# Patient Record
Sex: Female | Born: 1985 | Race: Black or African American | Hispanic: No | Marital: Single | Smoking: Former smoker
Health system: Southern US, Community
[De-identification: ages and names within clinical notes are randomized; demographics above are authoritative.]

## PROBLEM LIST (undated history)

## (undated) ENCOUNTER — Inpatient Hospital Stay (HOSPITAL_COMMUNITY): Payer: Self-pay

## (undated) DIAGNOSIS — K5010 Crohn's disease of large intestine without complications: Secondary | ICD-10-CM

## (undated) HISTORY — DX: Crohn's disease of large intestine without complications: K50.10

---
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
## 2007-03-06 ENCOUNTER — Emergency Department (HOSPITAL_COMMUNITY): Admission: EM | Admit: 2007-03-06 | Discharge: 2007-03-06 | Payer: Self-pay | Admitting: Emergency Medicine

## 2007-06-29 ENCOUNTER — Inpatient Hospital Stay (HOSPITAL_COMMUNITY): Admission: AD | Admit: 2007-06-29 | Discharge: 2007-06-29 | Payer: Self-pay | Admitting: Gynecology

## 2008-02-17 ENCOUNTER — Emergency Department (HOSPITAL_COMMUNITY): Admission: EM | Admit: 2008-02-17 | Discharge: 2008-02-17 | Payer: Self-pay | Admitting: Emergency Medicine

## 2008-03-03 ENCOUNTER — Emergency Department (HOSPITAL_COMMUNITY): Admission: EM | Admit: 2008-03-03 | Discharge: 2008-03-03 | Payer: Self-pay | Admitting: Emergency Medicine

## 2008-11-03 ENCOUNTER — Emergency Department (HOSPITAL_COMMUNITY): Admission: EM | Admit: 2008-11-03 | Discharge: 2008-11-03 | Payer: Self-pay | Admitting: Emergency Medicine

## 2008-11-30 ENCOUNTER — Ambulatory Visit (HOSPITAL_COMMUNITY): Admission: RE | Admit: 2008-11-30 | Discharge: 2008-11-30 | Payer: Self-pay | Admitting: Chiropractic Medicine

## 2009-05-24 ENCOUNTER — Inpatient Hospital Stay (HOSPITAL_COMMUNITY): Admission: AD | Admit: 2009-05-24 | Discharge: 2009-05-24 | Payer: Self-pay | Admitting: Obstetrics and Gynecology

## 2009-09-06 ENCOUNTER — Encounter: Admission: RE | Admit: 2009-09-06 | Discharge: 2009-09-06 | Payer: Self-pay | Admitting: Gastroenterology

## 2010-01-05 ENCOUNTER — Emergency Department (HOSPITAL_COMMUNITY): Admission: EM | Admit: 2010-01-05 | Discharge: 2010-01-05 | Payer: Self-pay | Admitting: Emergency Medicine

## 2010-05-02 LAB — POCT PREGNANCY, URINE: Preg Test, Ur: NEGATIVE

## 2010-05-28 LAB — URINALYSIS, ROUTINE W REFLEX MICROSCOPIC
Glucose, UA: NEGATIVE mg/dL
Leukocytes, UA: NEGATIVE
Nitrite: NEGATIVE
Protein, ur: 30 mg/dL — AB
Specific Gravity, Urine: 1.039 — ABNORMAL HIGH (ref 1.005–1.030)
Urobilinogen, UA: 1 mg/dL (ref 0.0–1.0)
Urobilinogen, UA: 1 mg/dL (ref 0.0–1.0)
pH: 6 (ref 5.0–8.0)
pH: 6 (ref 5.0–8.0)

## 2010-05-28 LAB — CBC
HCT: 40.2 % (ref 36.0–46.0)
HCT: 37.8 % (ref 36.0–46.0)
Hemoglobin: 13.4 g/dL (ref 12.0–15.0)
Hemoglobin: 12.8 g/dL (ref 12.0–15.0)
MCHC: 33.5 g/dL (ref 30.0–36.0)
MCV: 94.3 fL (ref 78.0–100.0)
Platelets: 247 10*3/uL (ref 150–400)
RBC: 4.26 MIL/uL (ref 3.87–5.11)
RBC: 4.06 MIL/uL (ref 3.87–5.11)
RDW: 12.6 % (ref 11.5–15.5)
WBC: 10 10*3/uL (ref 4.0–10.5)
WBC: 6.6 10*3/uL (ref 4.0–10.5)

## 2010-05-28 LAB — COMPREHENSIVE METABOLIC PANEL
ALT: 14 U/L (ref 0–35)
AST: 23 U/L (ref 0–37)
Albumin: 3.7 g/dL (ref 3.5–5.2)
Alkaline Phosphatase: 51 U/L (ref 39–117)
Alkaline Phosphatase: 38 U/L — ABNORMAL LOW (ref 39–117)
BUN: 12 mg/dL (ref 6–23)
BUN: 13 mg/dL (ref 6–23)
CO2: 23 mEq/L (ref 19–32)
Chloride: 110 mEq/L (ref 96–112)
GFR calc Af Amer: >60 mL/min (ref >60)
Glucose, Bld: 81 mg/dL (ref 70–99)
Potassium: 3.8 mEq/L (ref 3.5–5.1)
Potassium: 3.9 mEq/L (ref 3.5–5.1)
Sodium: 137 mEq/L (ref 135–145)
Total Bilirubin: 0.5 mg/dL (ref 0.3–1.2)
Total Protein: 7.3 g/dL (ref 6.0–8.3)

## 2010-05-28 LAB — DIFFERENTIAL
Basophils Absolute: 0 10*3/uL (ref 0.0–0.1)
Basophils Relative: 0 % (ref 0–1)
Eosinophils Absolute: 0.1 10*3/uL (ref 0.0–0.7)
Eosinophils Relative: 2 % (ref 0–5)
Lymphs Abs: 1.8 10*3/uL (ref 0.7–4.0)
Monocytes Absolute: 0.7 10*3/uL (ref 0.1–1.0)
Monocytes Relative: 7 % (ref 3–12)
Monocytes Relative: 12 % (ref 3–12)
Neutro Abs: 4.6 10*3/uL (ref 1.7–7.7)
Neutrophils Relative %: 70 % (ref 43–77)

## 2010-05-28 LAB — GLUCOSE, CAPILLARY
Glucose-Capillary: 68 mg/dL — ABNORMAL LOW (ref 70–99)
Glucose-Capillary: 65 mg/dL — ABNORMAL LOW (ref 70–99)

## 2010-05-28 LAB — URINE MICROSCOPIC-ADD ON

## 2010-05-28 LAB — LIPASE, BLOOD: Lipase: 19 U/L (ref 11–59)

## 2010-05-28 LAB — PREGNANCY, URINE: Preg Test, Ur: NEGATIVE

## 2010-05-28 LAB — POCT PREGNANCY, URINE: Preg Test, Ur: NEGATIVE

## 2010-06-29 NOTE — H&P (Signed)
NAME:  [name], [name] NO.:  [id number]   MEDICAL RECORD NO.:  [id number]          PATIENT TYPE:  INP   LOCATION:  9165                          FACILITY:  [facility]   PHYSICIAN:  Janine Limbo, M.D.DATE OF BIRTH:  May 31, 1985   DATE OF ADMISSION:  01/26/2004  DATE OF DISCHARGE:                                HISTORY & PHYSICAL   HISTORY OF PRESENT ILLNESS:  [name] is a 25 year old single black  female, primigravida at 39-1/7 weeks, who presents with regular uterine  contractions tonight.  She denies leaking, bleeding, headache, nausea and  vomiting or visual disturbances.  Her pregnancy has been followed by the  [facility] certified nurse midwife service and has been  remarkable for:  #1 - Age 89, #2 - first trimester BV, #3 - group B strep  negative.  Her prenatal labs were collected on September 15, 2003, hemoglobin  10.6, hematocrit 31.3, platelets 227,000, blood type A-positive, RH-  negative, sickle cell trait negative, RPR nonreactive, rubella immune,  hepatitis B surface antigen negative, Pap smear within normal limits,  gonorrhea negative, Chlamydia negative, cystic fibrosis negative, quad  screen within normal limits.  Her 1-hour Glucola from November 08, 2003 was  74; RPR at that time was nonreactive; hemoglobin at that time was 11.9.  Culture of the vaginal tract for group B strep in the third trimester was  negative.   HISTORY OF PRESENT PREGNANCY:  She presented for care at [facility] on  Jul 12, 2003 at 10-6/7 weeks' gestation.  Pregnancy ultrasonography at 20  weeks' gestation shows growth consistent with previous dating, confirming  [facility] of February 01, 2004.  The rest of her prenatal care was unremarkable.   OBSTETRICAL HISTORY:  She is a primigravida.   MEDICAL HISTORY:  She experienced menarche at the age of 61 with 30-day  cycle intervals.  She reports having had the usual childhood illnesses.   ALLERGIES:  She has no  medication allergies.  She says that MILK induces  vomiting.   SURGICAL HISTORY:  Negative.   FAMILY MEDICAL HISTORY:  Family medical history is remarkable for  grandmother with MI and stroke, maternal uncle with increased blood  pressure, paternal grandmother with leukemia, maternal grandmother with  edema, paternal uncle died of AIDS, paternal aunt had throat cancer,  maternal grandmother with history of depression.   GENETIC HISTORY:  Genetic history is remarkable for first cousin with  leaking valves and had surgery.  The patient states she does not know a lot  about the father of the baby's family history.   SOCIAL HISTORY:  Father of the baby is involved; his name is Vantiless.  They are of the [employer] faith.  The patient is in the 10th grade and a high  school student.  Father of the baby has completed high school and is  unemployed.  She denies any alcohol, tobacco or illicit drug use with the  pregnancy.   OBJECTIVE DATA:  VITAL SIGNS:  Vital signs are stable.  She is afebrile.  HEENT:  HEENT is grossly within normal limits.  CHEST:  Chest is clear to auscultation.  HEART:  Regular rate and rhythm.  ABDOMEN:  Abdomen is gravid in contour with fundal height extending  approximately 39 cm above the pubic symphysis.  Fetal heart rate is  reassuring with positive accelerations and no decelerations, contractions  every 4 minutes.  Cervix is 7 cm per R.N. exam.  EXTREMITIES:  Extremities are normal.   ASSESSMENT:  1.  Intrauterine pregnancy at term.  2.  Active labor.  3.  Group B streptococcus negative.   PLAN:  1.  Plan is to admit to birthing suite; Dr. Janine Limbo has been      notified.  2.  Routine C.N.M. orders.  3.  The patient plans IV pain medication.  4.  Anticipate normal spontaneous vaginal birth.     Kimb   KS/MEDQ  D:  01/26/2004  T:  01/26/2004  Job:  [id number]

## 2010-06-29 NOTE — H&P (Signed)
[name] NO.:  [id number]   MEDICAL RECORD NO.:  [id number]          PATIENT TYPE:  INP   LOCATION:  1326                         FACILITY:  [facility]   PHYSICIAN:  Hillery Aldo, M.D.   DATE OF BIRTH:  1985-05-21   DATE OF ADMISSION:  12/15/2005  DATE OF DISCHARGE:                                HISTORY & PHYSICAL   PRIMARY CARE PHYSICIAN:  The patient is unassigned.   CHIEF COMPLAINT:  Headache, fever, abdominal pain.   HISTORY OF PRESENT ILLNESS:  The patient is a 25 year old female with vague  complaints of abdominal discomfort, intermittent fever, and intermittent  headache who was originally seen in the emergency department on November 23, 2005 with vague abdominal complaints.  She was discharged on Protonix and  Bentyl.  She represented to the emergency department yesterday with  complaint of headache.  She was discharged after a workup was unrevealing  and diagnosed with a viral illness.  She returned today still feeling  unwell.  The patient reports that she has had 2 years of abdominal pain that  started after her son was born.  She reports frequent bowel movements up to  10 times daily, some of which are loose.  She has had loose stools over the  past several days as well.  She also has had 4 days of indigestion and  nausea but no frank vomiting.  Denies any melena or hematochezia.  She has  not used any excessive NSAIDs or aspirin.  She just recently started taking  Goody's Powders for the fever and headaches.  The patient states that she  has been running a fever for the past 24 hours and has had several days of a  constant pounding headache.  She denies any blurry vision except when going  from sitting to standing when she gets lightheaded.  She also reports some  double vision at times.  She complains of insomnia but cannot really specify  as to what is keeping her awake.  The patient denies being sexually active  since January of last  year.  She has not had any unprotected sex.   PAST MEDICAL HISTORY:  The patient's past medical history is essentially  unremarkable.  She has had no surgeries.  She has had one vaginal delivery  and one pregnancy.   FAMILY HISTORY:  The patient's mother is alive at 63 and healthy.  Her  father died at 31 secondary to murder.  She has 3 other siblings were good  health.  Her son is in good health.   SOCIAL HISTORY:  The patient is single and lives with her mother and 2-year-  old son.  She denies any tobacco, alcohol or illicit drug use.  She works as  a Conservation officer, nature.   ALLERGIES:  No known drug allergies.   MEDICATIONS:  None except for occasional Goody's Powders.   REVIEW OF SYSTEMS:  The patient has had fever but no chills.  Her appetite  is fair.  Her energy level is adequate.  Denies any weight changes.  No  chest pain, shortness of  breath.  She has an occasional cough that she  describes as dry.  No pharyngitis.  She recently has had some nocturia but  no dysuria or hematuria.  Denies any myalgias or arthralgias.  No history of  seizures.  No sick contacts.   PHYSICAL EXAMINATION:  Temperature is 102.7, pulse 128, respirations 18,  blood pressure 86/50.  GENERAL:  This is a well-developed, well-nourished female who is slightly  ill appearing and febrile.  HEENT: Normocephalic, atraumatic.  PERRL.  EOMI.  Oropharynx clear.  Moist  mucous membranes.  NECK:  Supple, no thyromegaly, no lymphadenopathy, no jugular venous  distension.  No appreciable nuchal rigidity, and the patient can touch her  chin to her chest without any difficulty.  CHEST:  Lungs clear to auscultation bilaterally with good air movement.  HEART:  Tachycardiac rate, regular rhythm.  No murmurs, rubs, or gallops.  ABDOMEN:  Soft, slightly tender to deep palpation across the right upper and  left upper quadrants.  She does have bowel sounds.  RECTAL EXAM:  Normal tone.  No stool in the vault.  The effluent is  heme  negative.  EXTREMITIES:  No clubbing, edema, cyanosis.  SKIN:  Hot to touch.  No rashes.  NEUROLOGIC:  The patient is alert and oriented x3.  Cranial nerves II-XII  grossly intact.  Nonfocal.   DATA REVIEWED:  Laboratory data reveals a negative strep screen and a  negative mono screen.  Sodium is 137, potassium 3.8, chloride 108, bicarb  22, BUN 11, creatinine 0.93, glucose 91.  LFTs are all within normal limits.  Urine pregnancy test is negative.  White blood cell count is 9.4, hemoglobin  12.8, hematocrit 37.5, platelet count 235.   ASSESSMENT/PLAN:  1. Febrile illness of uncertain etiology:  The patient is clearly ill and      has been so for a fairly extended period time.  Initial laboratory      workup has been unrevealing.  Given her hypotension, fever, and      tachycardia, I am concern of an occult infectious process.  Given this,      we will admit the patient and get cultures of her blood and urine.  We      will also check a CT scan of her head, abdomen, and pelvis.  Would also      obtain an LP under fluoroscopy and send her cerebrospinal fluid for      analysis including Gram's stain and culture.  There is a broad      differential diagnosis here with viral meningitis, pelvic inflammatory      disease or tubo-ovarian abscess being the main contenders.  I will not      empirically put her on antibiotics until we have a clear idea of where      the infectious source is located.  For now, I will treat her nausea      with antiemetics p.r.n. and use pain medications for abdominal pain      control.  Additionally, given her hypotension, I would check a random      cortisol to ensure that she does not have any evidence of adrenal      insufficiency.  Will also check Epstein-Barr virus titers and do stool      studies looking for ova and parasites, C.  Difficile toxin, and culture      looking for gastrointestinal intestinal pathogens.  Would also check a     chest x-ray  for completeness and a TSH level as well as a urine drug      screen.  2. Prophylaxis:  Will initiate GI prophylaxis with Protonix and early      ambulation for DVT prophylaxis.      Hillery Aldo, M.D.  Electronically Signed     CR/MEDQ  D:  12/15/2005  T:  12/16/2005  Job:  [id number]

## 2010-06-29 NOTE — Discharge Summary (Signed)
[name], [name] NO.:  [id number]   MEDICAL RECORD NO.:  [id number]          PATIENT TYPE:  INP   LOCATION:  1326                         FACILITY:  [facility]   PHYSICIAN:  Michaelyn Barter, M.D. DATE OF BIRTH:  25-Jul-1985   DATE OF ADMISSION:  12/15/2005  DATE OF DISCHARGE:  12/19/2005                               DISCHARGE SUMMARY   PRIMARY CARE PHYSICIAN:  Unassigned.   FINAL DIAGNOSES:  1. Ileitis with ulceration.  2. Colitis.  3. Abdominal pain.  4. Diarrhea.  5. Hypokalemia.  6. Anemia.  7. Febrile illness.  8. Iron deficiency anemia.   CONSULTATIONS:  1. Infectious Disease with Dr. Cliffton Asters.  2. Gastroenterology with Dr. Charna Elizabeth and Dr. Jeani Hawking.   PROCEDURES:  1. Colonoscopy done November8 by Dr. Jeani Hawking.  2. CT scan of the head without contrast material completed on      November4,2007.  3. Chest x-ray completed November4,2007.  4. CT scan of the abdomen and pelvis completed November4,2007.  5. Ultrasound of the pelvis completed November5,2007.  6. Transvaginal ultrasound completed November5,2007.  7. Attempt was made at a lumbar puncture on November4,2007 by      Radiology.   HISTORY OF PRESENT ILLNESS:  [name] is an 25 year old female who  had been seen in the ER initially on October13, at which time she had  complained of vague abdominal discomfort.  She was discharged home on  Protonix.  She came back to the ER on the date of admission complaining  of a headache.  She was worked up and discharged with a diagnosis of  viral illness.  On the date of her admission, she came back stating that  she did not feel well.  She indicated that she had had 2 years of  abdominal pain which started shortly after her son was born.  She also  complained of loose, frequent bowel movements, up to at least 10 a day.  There had been 4 days of indigestion and nausea.  She had just started  taking Goody's powders for fever and  headaches.  She complained of a  fever for at least the past 24 hours prior to this admission.   PAST MEDICAL HISTORY:  Please see that dictated by Dr. Trula Ore Rama on  November4,2007.   HOSPITAL COURSE:  PROBLEM #1 - FEBRILE ILLNESS:  At the time of her  admission, the patient was found to be hypotensive with a blood pressure  of 86/50 and heart rate of 128.  Blood cultures were ordered and on  November4, they were drawn, the results of which were no growth x2.  Stool cultures were done on November4; they were negative for  Salmonella, Shigella, Campylobacter and Yersinia.  An HIV test was also  done and was found to be nonreactive on November8.  Likewise an Malachi Carl virus test was completed; it was found to be 4.77 on  November4,2007.  A urinalysis was done on November4; it was negative.  A  chest x-ray was also done on November4 and it was found to reveal only  minimal  bronchitic changes.  A CT scan was done of the patient's head on  November4 and this revealed mild chronic ethmoid sinusitis.  No acute  intracranial findings were noted.  Because of this uncertain nature of  the patient's fevers, an LP was also ordered.  It was attempted, but the  patient was noted to have ooze from the puncture site.  It was believed  that this was likely related to the Goody's powder that she had been  taking.  The radiologist recommended holding all aspirin products for  several days and a repeat test could be completed at that particular  time.  Eventually, an Infectious Disease consult was placed and Dr. Cliffton Asters followed the patient.   PROBLEM #2 - ABDOMINAL PAIN AND DIARRHEA:  A CT scan was completed of  the patient's abdomen on November4.  The CT of the abdomen revealed  prominent wall thickening in the ascending colon consistent with  prominent colitis.  A mildly distended gallbladder was also noted.  Midabdominal collection of gas and fluid appear to likely represents 3   adjacent dilated loops of small bowel.  The absence of oral contrast  make evaluation of the bowel problematic.  CT scan of the pelvis  revealed abnormal free pelvic fluid and abnormal thick-walled bowel  present, especially in the right abdomen.  Air fluid levels in the  pelvis were likely within the bowel, but due to lack of oral contrast  and due to the presence of free pelvic fluid, a pelvic abscess could not  be excluded.  There also appeared to be abnormal wall thickening in the  transverse colon.  A gastroenterology consult was placed at that  particular time.  An ultrasound of the patient's pelvis was completed on  November5; it revealed a moderate amount of free pelvic fluid,  unremarkable uterus and ovary.  Again, Gastroenterology was consulted.  Dr. Charna Elizabeth initially saw the patient on November5.  Her final  impression was that the patient was suffering from systemic inflammatory  response syndrome with signs and symptoms concerning for infectious  colitis.  [name] eventually underwent a colonoscopy done on  November8 by Dr. Jeani Hawking.  Dr. Haywood Pao findings on the colonoscopy  were that the patient had internal and external hemorrhoids; there was  also a short segment of ileitis noted with ulceration.  Multiple cold  biopsies were taken.  In the descending colon, the mucosa was slightly  abnormal.  Pathology results revealed focal active inflammation  involving the terminal ileum, minimal focal active inflammation  involving the descending colon.  The ileal biopsy showed large lymphoid  aggregates and focally there was minimal active neutrophilic  inflammation associated with focal early erosion.  The inflammatory  changes were mild and the differential diagnosis included Crohn's  disease as well as NSAID-related inflammation.  No granulomas were  identified.  The random descending colon biopsies had some abnormalities which raised the possibility of a small  aphthoid-type lesion which could  be associated with Crohn's disease.  By [name], the patient indicated  that she was feeling much better and actually requested to go home.  It  took some convincing to get the patient to stay in the hospital for  further evaluation and also it took some convincing to get the patient  to stay long enough so that Gastroenterology could further evaluate her  and perform the colonoscopy, but again, by [facility], the patient's  abdominal pain had resolved.  With regards to the patient's  fevers, Dr.  Orvan Falconer indicated on [place] that it was unclear what caused the  fevers and why did the patient all of a sudden feel better.  Empiric  antibiotics had been initiated and he recommended that they continue;  the patient had been placed on Flagyl and ciprofloxacin.  Stool cultures  were ordered during the course of this hospitalization.  Stool cultures  done on November4 were negative.  Stool was sent for ova and parasites  and they were found to be negative.  C.  difficile was done on November5  and November6, both of which were negative.  C-reactive protein was done  and was found to be elevated at 7.7, as well as a sed rate, which was  slightly elevated 34.  Again, by the date of the patient's discharge,  her fevers had resolved   PROBLEM #3.  HYPOKALEMIA:  The patient received supplementation for this  during the course of her hospitalization.   PROBLEM #4.  ANEMIA:  This may have been an acute on chronic process for  this patient.  She did again complain of multiple episodes of diarrhea;  however, she did deny having any melena or hematochezia.  Therefore, the  anemia was monitored closely over the course of her hospitalization.  Iron studies were done and on November7, the patient's iron level was  noted to be 13, her TIBC 189 and a percent saturation was 7, therefore  confirming the diagnosis of iron deficiency anemia.   PROBLEM #5 - IRON DEFICIENCY  ANEMIA.  This was worked up over the course  of her hospitalization.  Further workup could take place once the  patient follows up with her primary care physician.   PROBLEM #6 - HEMATURIA/KETONURIA:  It was believed that dehydration may  have contributed to some of this.  Again, the patient did have a  urinalysis completed over the course of her hospitalization and it was  found to be negative for a urinary tract infection.  She was treated  empirically with ciprofloxacin and Flagyl during the course of this  hospitalization.   PROBLEM #7 - HYPOTENSION:  This may have been related to the multiple  bouts of diarrhea that the patient had.  This did resolve by the date of  the patient's discharge from the hospital.   PROBLEM #8 - SYSTEMIC INFLAMMATORY RESPONSE SYNDROME:  Again, the  trigger for this was questionable.  The patient was treated with empiric  IV antibiotics throughout her hospitalization and her fevers improved over the course of her hospitalization.  By the date of discharge, her  white blood cell count was normal.   CONDITION ON THE DATE OF DISCHARGE:  On the date of discharge, the  patient stated that she adamantly wanted to go home.  She had actually  threatened to leave AMA the day prior to the date of discharge.  On the  date of discharge,  her vitals were temperature of 99.8, heart rate 95,  respirations 20, blood pressure 110/67.  O2 SAT was 100% on room air.  Her white blood cell count was 4.6, hemoglobin 10.9, hematocrit is 31.7,  platelets 252,000.  Sodium 139, potassium 3.7, chloride 108, CO2 25, BUN  3, creatinine is 0.74, glucose 108.  Calcium is 8.5.  The decision was  made to discharge the patient from the hospital.   DISCHARGE MEDICATIONS:  The patient was discharged home on:  1. Protonix 40 mg p.o. daily.  2. Oxycodone 5 mg p.o. q.8 h. p.r.n.  DISCHARGE INSTRUCTIONS:  She was told to follow up with her regular  doctor within 1 week and to take all of  her medications as prescribed.      Michaelyn Barter, M.D.  Electronically Signed     OR/MEDQ  D:  01/13/2006  T:  01/14/2006  Job:  [id number]

## 2010-06-29 NOTE — Consult Note (Signed)
[name], [name] NO.:  [id number]   MEDICAL RECORD NO.:  [id number]          PATIENT TYPE:  INP   LOCATION:  1326                         FACILITY:  [facility]   PHYSICIAN:  Anselmo Rod, M.D.  DATE OF BIRTH:  1985/10/22   DATE OF CONSULTATION:  DATE OF DISCHARGE:                                   CONSULTATION   REQUESTING PHYSICIAN:  [facility] team - Dr. Hillery Aldo, M.D.   REASON FOR CONSULTATION:  Evaluation for chronic diarrhea.   HISTORY OF PRESENT ILLNESS:  [name] is an 25 year old African-  American woman admitted on December 15, 2005 for significant abdominal  discomfort, fever and headache.  She apparently was seen in the emergency  room on November 23, 2005 for some of the similar symptoms, at which point  she was diagnosed with a viral-like illness and treated with Bentyl and  Protonix.  She was recently also seen by the emergency room staff on  December 14, 2005 with a recurrence of her headache and viral-like illness.  Apparently, she was told she had some type of viral syndrome.   She was seen again on December 15, 2005, still complaining of a frontal-like  headache with significant epigastric pain.  She had also noted diminished  appetite.  She had basically stated that she was trying Goody's Powder with  no significant relief.  Apparently, she was also noted to have significant  fever with a temperature of 103.  She, the day prior to admission, was  treated with IV fluids for dehydration, and all lab work concludes  urinalysis was felt to be negative.  Upon further review, Ms. Maudlin states  that she has been having significant frequent bowel movements, up to at  least 10 per day, although she denies them as being loose.  She denies any  bright red blood per rectum or any evidence of blood.  She also denies  melena.  She denies nonsteroidal anti-inflammatory drugs, as well as  aspirin.  She denies any foreign travel.  She denies  any ill contacts.  She  only has been using the [employer] recently, but otherwise is not taking  any other medications.   PAST MEDICAL HISTORY:  Negative.   PAST SURGICAL HISTORY:  Negative.   GYNECOLOGICAL HISTORY:  She is a G1, P1 with a vaginal delivery and no  complications.   SOCIAL HISTORY:  She is currently single and lives with her mother.  She has  a 34-year-old son.  She denies tobacco, alcohol or illicit drug use.  She  works as a Conservation officer, nature.   FAMILY HISTORY:  Mom is still alive at the age of 52 without complications,  father died in his late 76s secondary to a murder, she does have three  siblings without medical problems.  There was no known GI illness in this  family.   ALLERGIES:  NO KNOWN DRUG ALLERGIES.   MEDICATIONS:  None, except for use of Goodies Powder x3 doses.   HOSPITAL MEDICATIONS:  At present, she is treated with IV Protonix and  Dilaudid for pain.  REVIEW OF SYSTEMS:  CONSTITUTIONAL:  She denies any significant weight  changes, although she does endorse fever.  CARDIOVASCULAR:  She denies chest  pains or palpitations.  RESPIRATORY:  Denies shortness of breath, does  endorse a slight dry cough.  GENITOURINARY:  Denies any dysuria or pyuria  but does endorse some episodes of nocturia.  In addition, she denies having  any recent sex or any history of sexually transmitted diseases.  RHEUMATOLOGIC AL:  She denies any arthralgias or myalgias.  NEUROLOGIC:  She does on occasion feel blurry vision when she gets  lightheaded but denies any significant pain, photophobia or scotoma.   PHYSICAL EXAMINATION:  VITAL SIGNS:  Temperature 102.5, blood pressure  112/65 with a pulse of 119, respirations at 20 with 99% oxygenation on room  air.  GENERAL:  [name] was very alert and oriented this afternoon and did not  appear to be under any significant distress.  HEENT:  Oropharynx was unremarkable for any lesions or ulcers.  Pupils are  equal and reactive to  light and accommodation.  Sclerae was anicteric.  There was no cervical, supraclavicular, submandibular or axillary adenopathy  bilaterally, no thyromegaly.  LUNGS:  Clear to auscultation bilaterally with no wheezes, rales or rhonchi.  CARDIOVASCULAR:  Showed a tachycardic rate but otherwise is regular in it's  rhythm with no murmurs, rubs or gallops, and radial pulses were +2 and  strong bilaterally.  ABDOMEN:  Appeared somewhat slightly firm with hypoactive bowel sound  throughout.  There was some slight tenderness to palpation along all  quadrants with no focal point tenderness.  No rebounds, no guarding, no  hepatosplenomegaly appreciated.  SKIN:  Revealed unremarkable for rashes.  EXTREMITIES:  Also showed no significant clubbing or edema.   LABORATORY STUDIES:  Serum on December 15, 2005:  Sodium 138, potassium 3.4,  bicarb 22, chloride 110, BUN 5, creatinine 0.8, glucose 103, total bilirubin  1.1, alk phos 67, AST 24, ALT 20, albumin 2.7, total protein 6.0, calcium  8.3, hemoglobin 12.2, hematocrit 35.9, platelets 197, white blood cell count  9.1 with an absolute neutrophil count of 7.3.  TSH is 0.78, cortisol 8.6,  influenza A and B antigen was negative.  Streptococcus A screen negative,  mono screen negative.  Blood cultures have been negative x2.  Urinalysis  showed many bacteria with many squamous epithelial cells.  It was cloudy  with greater than 80 ketones and large amount of blood with small leukocyte  esterase.   IMAGING:  CT of the abdomen and pelvis obtained December 15, 2005  demonstrates abnormal wall thickening of the ascending colon, splenic  flexure, transverse colon, cecum with mucosal enhancement consistent with  colitis.  There is also some free fluid within the pelvis.  Concern for  Crohn's versus pseudomembranous colitis was raised.   IMPRESSION:  1. Systemic inflammatory response syndrome with signs and symptoms     concerning for infectious colitis.  Ms.  Minichiello denies any recent      antibiotic use and does not appear to have any family history for      inflammatory bowel disease.  However, in light of her fever,      tachycardia and slight leukocytosis, will proceed with at least      flexible sigmoidoscopy and possible biopsies to rule out Crohn's or      other inflammatory bowel disease.  2. Hematuria.  Given degree of hematuria with ketonuria, consideration for      urological workup may be  in order, if urine culture is unremarkable.  3. Generalized headache.  Primary care team was somewhat concerned for      viral syndrome with possible      meningitis.  Patient has proceeded for a fluoroscopic-guided lumbar      puncture to evaluate CSF for any evidence of infection.  Further      management per primary team.   Case has been discussed with the attending physician, Dr. Loreta Ave.      Coralie Carpen, M.D.      Anselmo Rod, M.D.  Electronically Signed    FR/MEDQ  D:  12/16/2005  T:  12/16/2005  Job:  [id number]   cc:   Hillery Aldo, M.D.   Anselmo Rod, M.D.  Fax: [phone]

## 2010-10-21 IMAGING — CR DG ABDOMEN ACUTE W/ 1V CHEST
3 series · 3 of 3 positions shown · non-contrast
Comparison: 12/15/2005 chest x-ray

CLINICAL DATA: Abdominal pain.

ACUTE ABDOMEN SERIES (ABDOMEN 2 VIEW & CHEST 1 VIEW)

[w chest pa]
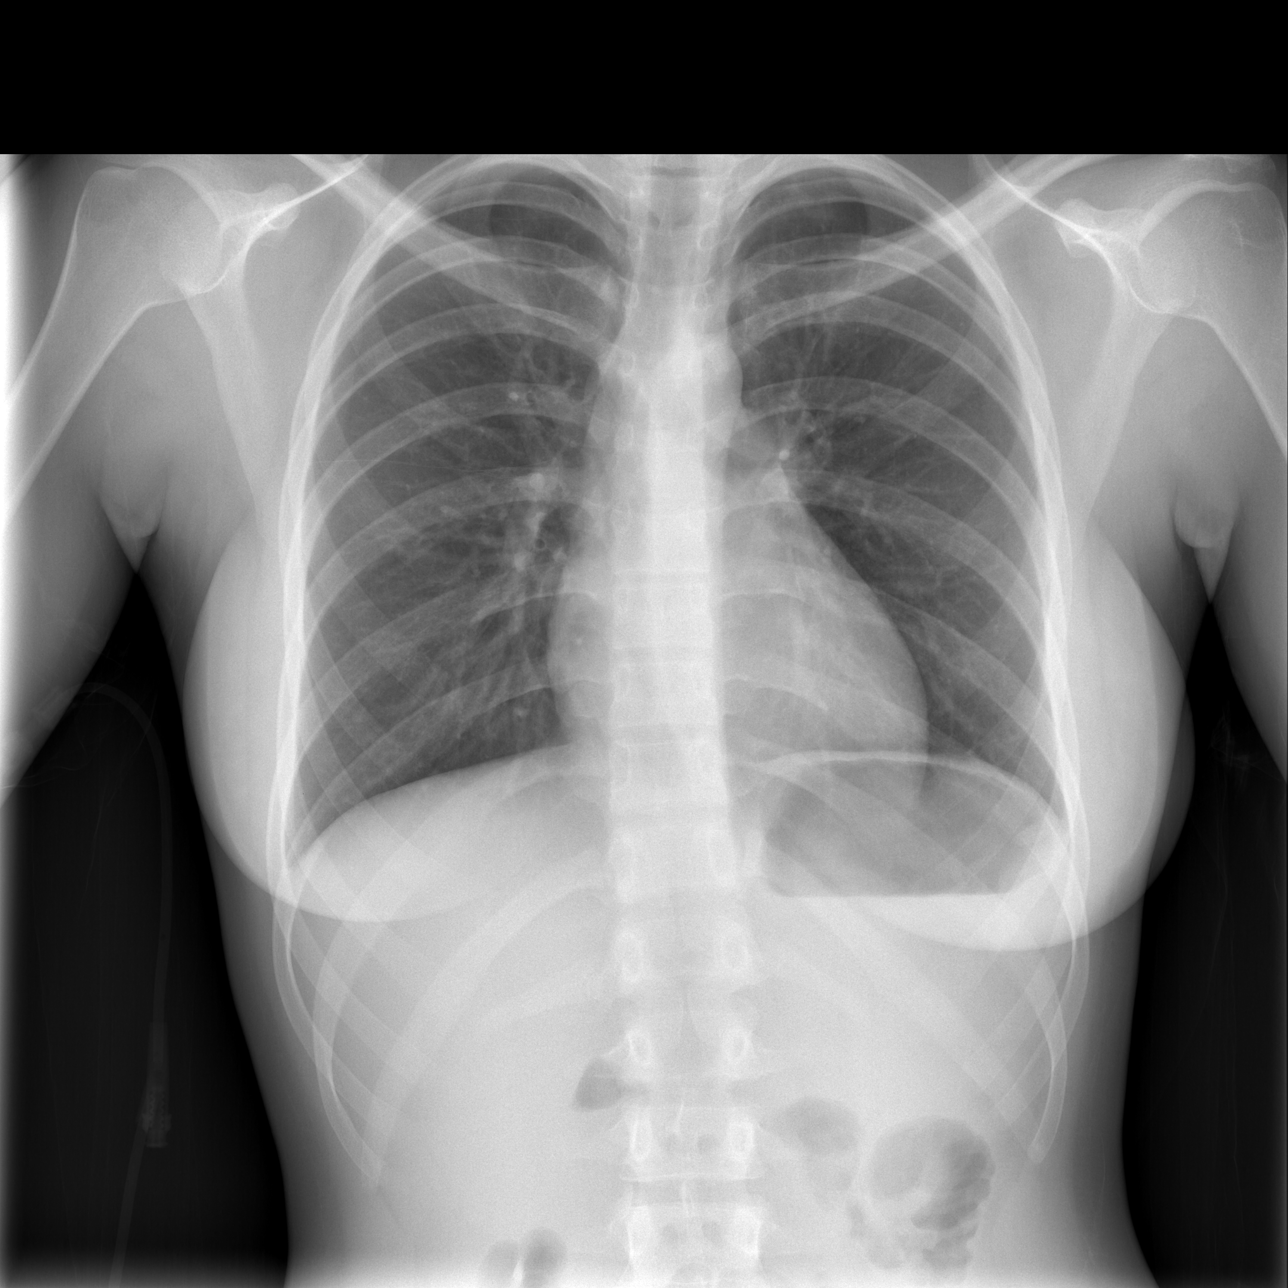

[w abdomen upright *]
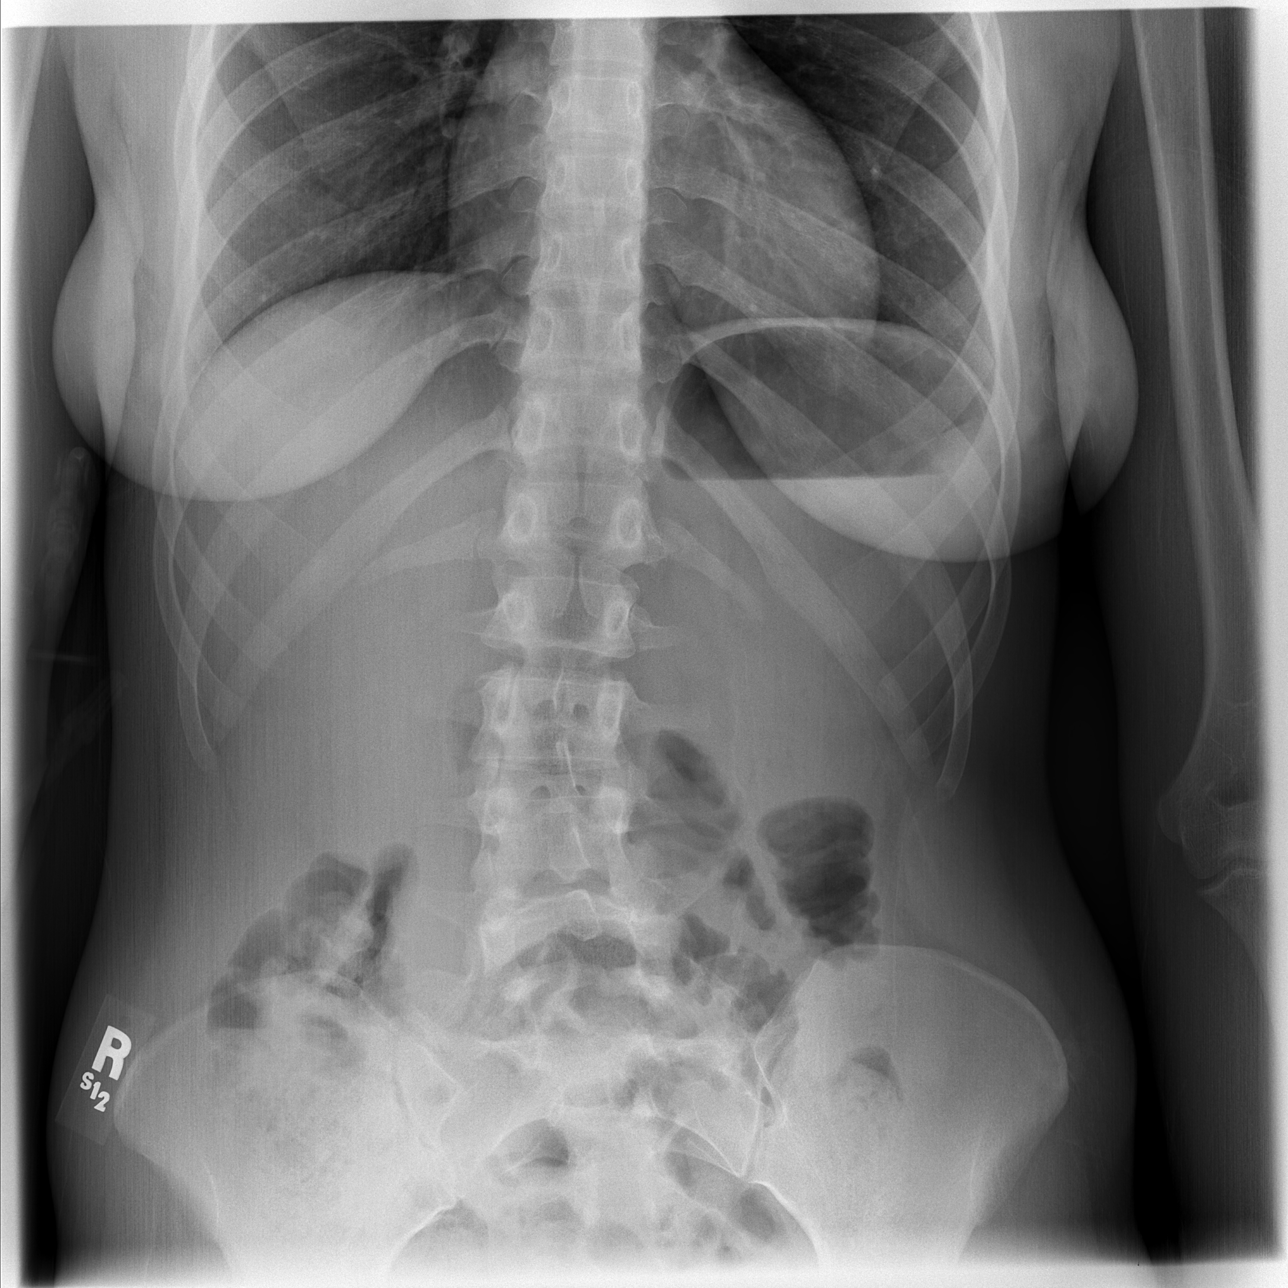

[t abdomen supine]
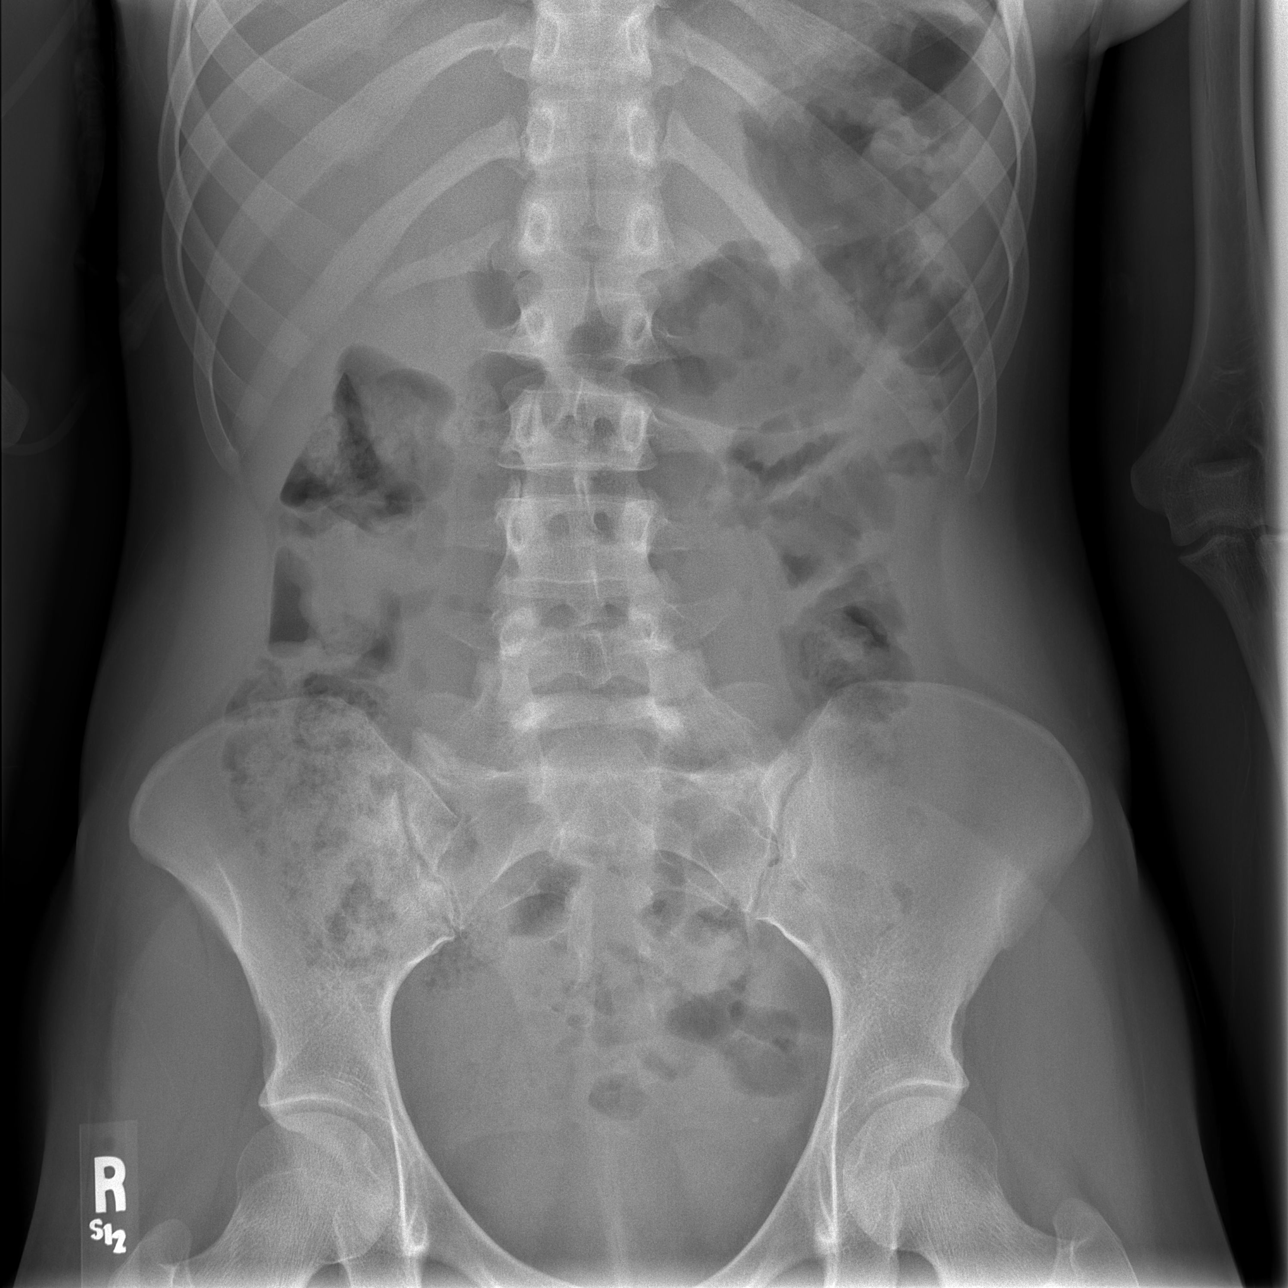

[3 of 3 positions shown; findings below may reference images not displayed]

FINDINGS: Cardiomediastinal silhouette is unremarkable.
The lungs are clear.
No evidence of focal airspace disease, pleural effusions, or
pneumothorax noted.

Nondistended gas-filled small bowel is present with some gas and
stool in the colon.
There is no evidence of bowel obstruction or pneumoperitoneum.
Gas and fluid in the stomach is identified.
No acute bony abnormalities are identified. A mild lumbar scoliosis
is identified.
IMPRESSION: Nonspecific nonobstructive bowel gas pattern - no evidence of
pneumoperitoneum.

No evidence of acute cardiopulmonary disease.

## 2010-11-01 LAB — RAPID STREP SCREEN (MED CTR MEBANE ONLY): Streptococcus, Group A Screen (Direct): NEGATIVE

## 2010-11-07 LAB — GC/CHLAMYDIA PROBE AMP, GENITAL: GC Probe Amp, Genital: NEGATIVE

## 2010-11-07 LAB — URINALYSIS, ROUTINE W REFLEX MICROSCOPIC
Bilirubin Urine: NEGATIVE
Hgb urine dipstick: NEGATIVE
Ketones, ur: NEGATIVE
Nitrite: NEGATIVE
Specific Gravity, Urine: 1.02
Urobilinogen, UA: 0.2
pH: 7

## 2010-11-07 LAB — WET PREP, GENITAL
Trich, Wet Prep: NONE SEEN
Yeast Wet Prep HPF POC: NONE SEEN

## 2010-11-07 LAB — POCT PREGNANCY, URINE: Operator id: 13440

## 2016-11-21 ENCOUNTER — Emergency Department (HOSPITAL_COMMUNITY)

## 2016-11-21 ENCOUNTER — Encounter (HOSPITAL_COMMUNITY): Payer: Self-pay | Admitting: *Deleted

## 2016-11-21 ENCOUNTER — Emergency Department (HOSPITAL_COMMUNITY)
Admission: EM | Admit: 2016-11-21 | Discharge: 2016-11-21 | Disposition: A | Discharge status: Home / Self Care | Attending: Emergency Medicine | Admitting: Emergency Medicine

## 2016-11-21 DIAGNOSIS — W231XXA Caught, crushed, jammed, or pinched between stationary objects, initial encounter: Secondary | ICD-10-CM | POA: Diagnosis not present

## 2016-11-21 DIAGNOSIS — Z23 Encounter for immunization: Secondary | ICD-10-CM | POA: Diagnosis not present

## 2016-11-21 DIAGNOSIS — Y9289 Other specified places as the place of occurrence of the external cause: Secondary | ICD-10-CM | POA: Insufficient documentation

## 2016-11-21 DIAGNOSIS — Y9389 Activity, other specified: Secondary | ICD-10-CM | POA: Diagnosis not present

## 2016-11-21 DIAGNOSIS — S62636B Displaced fracture of distal phalanx of right little finger, initial encounter for open fracture: Secondary | ICD-10-CM

## 2016-11-21 DIAGNOSIS — F17200 Nicotine dependence, unspecified, uncomplicated: Secondary | ICD-10-CM | POA: Diagnosis not present

## 2016-11-21 DIAGNOSIS — Y990 Civilian activity done for income or pay: Secondary | ICD-10-CM | POA: Insufficient documentation

## 2016-11-21 DIAGNOSIS — S6991XA Unspecified injury of right wrist, hand and finger(s), initial encounter: Secondary | ICD-10-CM | POA: Diagnosis present

## 2016-11-21 MED ORDER — FENTANYL CITRATE (PF) 100 MCG/2ML IJ SOLN
25.0000 ug | Freq: Once | INTRAMUSCULAR | Status: AC
  Administered 2016-11-21: 25 ug via INTRAVENOUS
  Filled 2016-11-21: qty 2

## 2016-11-21 MED ORDER — FENTANYL CITRATE (PF) 100 MCG/2ML IJ SOLN: 25.0000 ug | Freq: Once | INTRAMUSCULAR | Status: DC

## 2016-11-21 MED ORDER — CEPHALEXIN 500 MG PO CAPS: 500.0000 mg | ORAL_CAPSULE | Freq: Three times a day (TID) | ORAL | 0 refills | Status: AC

## 2016-11-21 MED ORDER — HYDROCODONE-ACETAMINOPHEN 5-325 MG PO TABS: 1.0000 | ORAL_TABLET | Freq: Four times a day (QID) | ORAL | 0 refills | Status: DC | PRN

## 2016-11-21 MED ORDER — TETANUS-DIPHTH-ACELL PERTUSSIS 5-2.5-18.5 LF-MCG/0.5 IM SUSP
0.5000 mL | Freq: Once | INTRAMUSCULAR | Status: AC
  Administered 2016-11-21: 0.5 mL via INTRAMUSCULAR
  Filled 2016-11-21: qty 0.5

## 2016-11-21 MED ORDER — LIDOCAINE HCL (PF) 1 % IJ SOLN
20.0000 mL | Freq: Once | INTRAMUSCULAR | Status: AC
  Administered 2016-11-21: 20 mL via INTRADERMAL

## 2016-11-21 MED ORDER — LIDOCAINE HCL (PF) 1 % IJ SOLN
INTRAMUSCULAR | Status: AC
  Filled 2016-11-21: qty 30

## 2016-11-21 MED ORDER — LIDOCAINE HCL 1 % IJ SOLN
20.0000 mL | Freq: Once | INTRAMUSCULAR | Status: DC
  Filled 2016-11-21: qty 20

## 2016-11-21 NOTE — Discharge Instructions (Signed)
Please read instructions below. Apply ice to your finger for 20 minutes at a time. Begin taking the antibiotic, Keflex, 3 times per day until it is gone. You can take hydrocodone every 6 hours as needed for severe pain. Do not take tylenol, drive, or drink alcohol while taking this medication. Schedule an appointment with Dr. Izora Ribas in 1 week to follow-up on your injury. Return to the ER for fever, pus draining from your finger, or new or concerning symptoms.

## 2016-11-21 NOTE — ED Triage Notes (Signed)
Pt slammed right little finger in the window ceil and a wire cage.  Pt has nail lifted and deformity.  Bleeding controlled.

## 2016-11-21 NOTE — ED Provider Notes (Signed)
[facility]-EMERGENCY DEPT Provider Note   CSN: [id number] Arrival date & time: 11/21/16  0865     History   Chief Complaint Chief Complaint  Patient presents with  . Finger Injury    HPI [name] is a 31 y.o. female without significant past medical history, presenting to the ED for acute onset of right fifth finger pain that occurred prior to arrival. Patient states she jammed her finger in between a window sill and a wire cage at work. She reports mild pain to the finger that is worse with movement. Tetanus is not up-to-date. No other injuries noted.  The history is provided by the patient.    History reviewed. No pertinent past medical history.  There are no active problems to display for this patient.   History reviewed. No pertinent surgical history.  OB History    No data available       Home Medications    Prior to Admission medications   Not on File    Family History No family history on file.  Social History Social History  Substance Use Topics  . Smoking status: Current Some Day Smoker  . Smokeless tobacco: Never Used  . Alcohol use No     Allergies   Patient has no known allergies.   Review of Systems Review of Systems  Musculoskeletal: Positive for arthralgias.  Skin: Positive for wound.     Physical Exam Updated Vital Signs BP 128/75   Pulse 80   Temp 98.6 F (37 C) (Oral)   Resp 16   LMP 11/19/2016   SpO2 100%   Physical Exam  Constitutional: She appears well-developed and well-nourished. No distress.  HENT:  Head: Normocephalic and atraumatic.  Eyes: Conjunctivae are normal.  Cardiovascular: Intact distal pulses.   Pulmonary/Chest: Effort normal.  Musculoskeletal:  Right fifth digit with deformity and edema to distal phalanx. Nail matrix is exposed and displaced outside of the skin. Normal sensation to tip of finger. Distal finger is warm and pink. Preserved active flexion and extension of digit. See images.    Psychiatric: She has a normal mood and affect. Her behavior is normal.  Nursing note and vitals reviewed.          ED Treatments / Results  Labs (all labs ordered are listed, but only abnormal results are displayed) Labs Reviewed - No data to display  EKG  EKG Interpretation None       Radiology Dg Finger Little Right  Result Date: 11/21/2016 CLINICAL DATA:  Right fifth finger injury today. EXAM: RIGHT LITTLE FINGER 2+V COMPARISON:  None. FINDINGS: Moderately displaced and comminuted fracture is seen involving the fifth distal phalanx. No soft tissue abnormality is noted. Joint spaces are intact. IMPRESSION: Moderately displaced and comminuted fifth distal phalangeal fracture. Electronically Signed   By: Lupita Raider, M.D.   On: 11/21/2016 08:46    Procedures Procedures (including critical care time)  Medications Ordered in ED Medications  Tdap (BOOSTRIX) injection 0.5 mL (0.5 mLs Intramuscular Given 11/21/16 1029)  fentaNYL (SUBLIMAZE) injection 25 mcg (25 mcg Intravenous Given 11/21/16 1217)     Initial Impression / Assessment and Plan / ED Course  I have reviewed the triage vital signs and the nursing notes.  Pertinent labs & imaging results that were available during my care of the patient were reviewed by me and considered in my medical decision making (see chart for details).     Pt presenting with right fifth finger injury after slamming her finger  between a wire crate and a windowsill today. X-ray with moderately displaced and comminuted distal phalanx fracture. On exam, nail matrix is exposed and displaced. NV intact. Finger soaked. Tdap updated. Consulted hand specialist, spoke with Shawna Orleans NP, who works with Dr. Izora Ribas. She states they will be scheduling pt in the OR today for treatment. Patient is to remain NPO. Pain treated in ED, while awaiting surgery.  Patient discussed with Dr. Criss Alvine.  Final Clinical Impressions(s) / ED Diagnoses   Final  diagnoses:  Displaced fracture of distal phalanx of right little finger, initial encounter for open fracture    New Prescriptions New Prescriptions   No medications on file     Russo, [place] N, PA-C 11/21/16 1325    Pricilla Loveless, MD 11/22/16 1651

## 2016-11-21 NOTE — ED Notes (Signed)
Ortho at bedside.

## 2016-11-21 NOTE — Brief Op Note (Signed)
   3:09 PM  PATIENT:  [name]  31 y.o. female  PRE-OPERATIVE DIAGNOSIS:  Open fracture to distal phalanx, nail bed injury to right small finger  POST-OPERATIVE DIAGNOSIS:  same  PROCEDURE:  ORIF distal phalanx fracture with 22g needle, partial nail plate removal, nail bed repair  SURGEON: Rosalio Catterton ANESTHESIA:   local   FINDINGS:  Open fracture, nail bed dislocation   PATIENT DISPOSITION:  home

## 2016-11-21 NOTE — Consult Note (Signed)
Reason for Consult:finger inju;ry Referring Physician: ER  CC:I hurt my finger at work  HPI:  [name] is an 31 y.o. right handed female who presents with small finger injury from work.  Pt states she slammed her finger between two objects at work this am, c/o bleeding, pain, deformity of small finger       .   Pain is rated at  8  /10 and is described as sharp  Pain is constant.  Pain is made better by rest/immobilization, worse with motion.   Associated signs/symptoms: Previous treatment:    History reviewed. No pertinent past medical history.  History reviewed. No pertinent surgical history.  No family history on file.  Social History:  reports that she has been smoking.  She has never used smokeless tobacco. She reports that she does not drink alcohol or use drugs.  Allergies: No Known Allergies  Medications: I have reviewed the patient's current medications.  No results found for this or any previous visit (from the past 48 hour(s)).  Dg Finger Little Right  Result Date: 11/21/2016 CLINICAL DATA:  Right fifth finger injury today. EXAM: RIGHT LITTLE FINGER 2+V COMPARISON:  None. FINDINGS: Moderately displaced and comminuted fracture is seen involving the fifth distal phalanx. No soft tissue abnormality is noted. Joint spaces are intact. IMPRESSION: Moderately displaced and comminuted fifth distal phalangeal fracture. Electronically Signed   By: Lupita Raider, M.D.   On: 11/21/2016 08:46    Pertinent items are noted in HPI. Temp:  [98.6 F (37 C)] 98.6 F (37 C) (10/11 0758) Pulse Rate:  [80-113] 81 (10/11 1400) Resp:  [14-20] 14 (10/11 1400) BP: (112-154)/(63-94) 112/63 (10/11 1400) SpO2:  [99 %-100 %] 99 % (10/11 1400) General appearance: alert and cooperative Resp: clear to auscultation bilaterally Cardio: regular rate and rhythm GI: soft, non-tender; bowel sounds normal; no masses,  no organomegaly Extremities: extremities normal, atraumatic, no cyanosis or  edema Except for right small finger with obvious nail deformity, nail dislocation  Assessment: Open fracture of distal phalanx, nail bed injury Plan: Needs distal phalanx reduction, nail bed repair. I have discussed this treatment plan in detail with patient, including the risks of the recommended treatment or surgery, the benefits and the alternatives.  The patient understands that additional treatment may be necessary.  Shirin Echeverry C Ermalinda Joubert 11/21/2016, 3:05 PM

## 2019-01-06 ENCOUNTER — Other Ambulatory Visit: Payer: Self-pay

## 2019-01-06 DIAGNOSIS — Z20822 Contact with and (suspected) exposure to covid-19: Secondary | ICD-10-CM

## 2019-01-07 LAB — NOVEL CORONAVIRUS, NAA: SARS-CoV-2, NAA: NOT DETECTED

## 2019-07-08 ENCOUNTER — Ambulatory Visit (HOSPITAL_COMMUNITY)
Admission: RE | Admit: 2019-07-08 | Discharge: 2019-07-08 | Disposition: A | Discharge status: Home / Self Care | Payer: PRIVATE HEALTH INSURANCE | Attending: Psychiatry | Admitting: Psychiatry

## 2019-07-08 DIAGNOSIS — F4481 Dissociative identity disorder: Secondary | ICD-10-CM | POA: Diagnosis not present

## 2019-07-08 DIAGNOSIS — F410 Panic disorder [episodic paroxysmal anxiety] without agoraphobia: Secondary | ICD-10-CM | POA: Insufficient documentation

## 2019-07-08 DIAGNOSIS — Z818 Family history of other mental and behavioral disorders: Secondary | ICD-10-CM | POA: Insufficient documentation

## 2019-07-08 DIAGNOSIS — F329 Major depressive disorder, single episode, unspecified: Secondary | ICD-10-CM | POA: Insufficient documentation

## 2019-07-08 DIAGNOSIS — F411 Generalized anxiety disorder: Secondary | ICD-10-CM | POA: Diagnosis not present

## 2019-07-08 DIAGNOSIS — R45851 Suicidal ideations: Secondary | ICD-10-CM | POA: Insufficient documentation

## 2019-07-08 DIAGNOSIS — F419 Anxiety disorder, unspecified: Secondary | ICD-10-CM | POA: Diagnosis not present
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
## 2021-06-13 ENCOUNTER — Ambulatory Visit (INDEPENDENT_AMBULATORY_CARE_PROVIDER_SITE_OTHER): Payer: No Typology Code available for payment source | Admitting: Radiology

## 2021-06-13 ENCOUNTER — Other Ambulatory Visit (HOSPITAL_COMMUNITY)
Admission: RE | Admit: 2021-06-13 | Discharge: 2021-06-13 | Disposition: A | Discharge status: Home / Self Care | Payer: No Typology Code available for payment source | Source: Ambulatory Visit | Attending: Radiology | Admitting: Radiology

## 2021-06-13 ENCOUNTER — Encounter: Payer: Self-pay | Admitting: Radiology

## 2021-06-13 VITALS — BP 126/84 | Ht 66.75 in | Wt 222.0 lb

## 2021-06-13 DIAGNOSIS — Z30018 Encounter for initial prescription of other contraceptives: Secondary | ICD-10-CM | POA: Diagnosis not present

## 2021-06-13 DIAGNOSIS — Z01419 Encounter for gynecological examination (general) (routine) without abnormal findings: Secondary | ICD-10-CM

## 2021-06-13 DIAGNOSIS — Z113 Encounter for screening for infections with a predominantly sexual mode of transmission: Secondary | ICD-10-CM

## 2021-06-13 MED ORDER — PHEXXI 1.8-1-0.4 % VA GEL: 5.0000 g | Freq: Every day | VAGINAL | 11 refills | Status: DC

## 2021-06-13 NOTE — Progress Notes (Signed)
? ?[name] 1985/10/10 [id number] ? ? ?History:  36 y.o. G1P0 presents for annual exam. No gyn concerns. Interested in non hormonal on demand BC. Has used hormones in the past and had irregular bleeding.  ? ?Gynecologic History ?Patient's last menstrual period was 06/04/2021 (approximate). ?Period Cycle (Days): 28 ?Period Duration (Days): 7 ?Period Pattern: Regular ?Menstrual Flow: Heavy (heavy 2nd day) ?Dysmenorrhea: (!) Mild ?Dysmenorrhea Symptoms: Cramping ?Contraception/Family planning: none ?Sexually active: yes ?Last Pap: 2016. Results were: abnormal ? ? ?Obstetric History ?OB History  ?Gravida Para Term Preterm AB Living  ?1         1  ?SAB IAB Ectopic Multiple Live Births  ?        1  ?  ?# Outcome Date GA Lbr Len/2nd Weight Sex Delivery Anes PTL Lv  ?1 Gravida           ? ? ? ?The following portions of the patient's history were reviewed and updated as appropriate: allergies, current medications, past family history, past medical history, past social history, past surgical history, and problem list. ? ?Review of Systems ?Pertinent items noted in HPI and remainder of comprehensive ROS otherwise negative.  ? ?Past medical history, past surgical history, family history and social history were all reviewed and documented in the EPIC chart. ? ? ?Exam: ? ?Vitals:  ? 06/13/21 1342  ?BP: 126/84  ?Weight: 222 lb (100.7 kg)  ?Height: 5' 6.75" (1.695 m)  ? ?Body mass index is 35.03 kg/m?. ? ?General appearance:  Normal ?Thyroid:  Symmetrical, normal in size, without palpable masses or nodularity. ?Respiratory ? Auscultation:  Clear without wheezing or rhonchi ?Cardiovascular ? Auscultation:  Regular rate, without rubs, murmurs or gallops ? Edema/varicosities:  Not grossly evident ?Abdominal ? Soft,nontender, without masses, guarding or rebound. ? Liver/spleen:  No organomegaly noted ? Hernia:  None appreciated ? Skin ? Inspection:  Grossly normal ?Breasts: Examined lying and sitting.  ? Right: Without masses,  retractions, nipple discharge or axillary adenopathy. ? ? Left: Without masses, retractions, nipple discharge or axillary adenopathy. ?Genitourinary  ? Inguinal/mons:  Normal without inguinal adenopathy ? External genitalia:  Normal appearing vulva with no masses, tenderness, or lesions ? BUS/Urethra/Skene's glands:  Normal without masses or exudate ? Vagina:  Normal appearing with normal color and discharge, no lesions ? Cervix:  Normal appearing without discharge or lesions ? Uterus:  Normal in size, shape and contour.  Mobile, nontender ? Adnexa/parametria:   ?  Rt: Normal in size, without masses or tenderness. ?  Lt: Normal in size, without masses or tenderness. ? Anus and perineum: Normal ?  ?Patient informed chaperone available to be present for breast and pelvic exam. Patient has requested no chaperone to be present. Patient has been advised what will be completed during breast and pelvic exam.  ? ?Assessment/Plan:   ?1. Well woman exam with routine gynecological exam ? ?- Cytology - PAP( [place]) with STI screen ? ?2. Routine screening for STI (sexually transmitted infection) ? ?- HIV antibody (with reflex) ?- RPR ?- Hepatitis C antibody ? ?3. Encounter for initial prescription of other contraceptives ? ?- Lactic Ac-Citric Ac-Pot Bitart (PHEXXI) 1.8-1-0.4 % GEL; Place 5 g vaginally daily. As needed  Dispense: 180 g; Refill: 11  ? ? ?Discussed SBE, pap and STI screening as directed/appropriate. Recommend of exercise weekly, including weight bearing exercise. Encouraged the use of seatbelts and sunscreen. ?Return in 1 year for annual or as needed.  ? ?Jeannie Mallinger B WHNP-BC 2:19 PM  06/13/2021  ?

## 2021-06-14 ENCOUNTER — Telehealth: Payer: Self-pay | Admitting: *Deleted

## 2021-06-14 LAB — HIV ANTIBODY (ROUTINE TESTING W REFLEX): HIV 1&2 Ab, 4th Generation: NONREACTIVE

## 2021-06-14 LAB — HEPATITIS C ANTIBODY
Hepatitis C Ab: NONREACTIVE
SIGNAL TO CUT-OFF: 0.11 (ref <1.00)

## 2021-06-14 LAB — RPR: RPR Ser Ql: NONREACTIVE

## 2021-06-14 NOTE — Telephone Encounter (Signed)
PA done via cover my meds for Phexxi 1.8-1.0.4% gel. Pending response from [employer].  

## 2021-06-15 ENCOUNTER — Other Ambulatory Visit: Payer: Self-pay

## 2021-06-15 DIAGNOSIS — R87612 Low grade squamous intraepithelial lesion on cytologic smear of cervix (LGSIL): Secondary | ICD-10-CM

## 2021-06-15 LAB — CYTOLOGY - PAP
Chlamydia: NEGATIVE
Comment: NEGATIVE
Comment: NEGATIVE
Comment: NEGATIVE
Comment: NEGATIVE
Comment: NEGATIVE
Comment: NORMAL
HPV 16: NEGATIVE
HPV 18 / 45: NEGATIVE
High risk HPV: POSITIVE — AB
Neisseria Gonorrhea: NEGATIVE
Trichomonas: NEGATIVE

## 2021-06-15 NOTE — Progress Notes (Signed)
GYNECOLOGY  VISIT ?  ?HPI: ?36 y.o.   Single  African American  female   ?G1P0 with Patient's last menstrual period was 06/04/2021 (approximate).   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   ?Her prior pap was 7 years ago, and it was normal then. ? ?States she has a history of HPV as a teen following pregnancy at age 44. ?No prior treatment.  ? ?Last intercourse was 3 weeks ago.   ? ?UPT negative today. ? ?GYNECOLOGIC HISTORY: ?Patient's last menstrual period was 06/04/2021 (approximate). ?Contraception: none ?Menopausal hormone therapy: none ?Last mammogram: n/a ?Last pap smear: 06/13/21-LGSIL, HPV+ ?       ?OB History   ? ? Gravida  ?1  ? Para  ?   ? Term  ?   ? Preterm  ?   ? AB  ?   ? Living  ?1  ?  ? ? SAB  ?   ? IAB  ?   ? Ectopic  ?   ? Multiple  ?   ? Live Births  ?1  ?   ?  ?  ?    ? ?There are no problems to display for this patient. ? ? ?Past Medical History:  ?Diagnosis Date  ? Crohn's colitis (HCC)   ? ? ?No past surgical history on file. ? ?Current Outpatient Medications  ?Medication Sig Dispense Refill  ? Cholecalciferol 1.25 MG (50000 UT) capsule Take by mouth.    ? Lactic Ac-Citric Ac-Pot Bitart (PHEXXI) 1.8-1-0.4 % GEL Place 5 g vaginally daily. As needed 180 g 11  ? VITAMIN E PO Take by mouth.    ? ?No current facility-administered medications for this visit.  ?  ? ?ALLERGIES: Patient has no known allergies. ? ?Family History  ?Problem Relation Age of Onset  ? Colon cancer Mother   ?     age at onset 6  ? Hypertension Mother   ? ? ?Social History  ? ?Socioeconomic History  ? Marital status: Single  ?  Spouse name: Not on file  ? Number of children: Not on file  ? Years of education: Not on file  ? Highest education level: Not on file  ?Occupational History  ? Not on file  ?Tobacco Use  ? Smoking status: Former  ?  Types: Cigarettes  ? Smokeless tobacco: Never  ?Substance and Sexual Activity  ? Alcohol use: No  ? Drug use: No  ? Sexual activity: Yes  ?  Partners: Male  ?  Birth control/protection:  None  ?Other Topics Concern  ? Not on file  ?Social History Narrative  ? Not on file  ? ?Social Determinants of Health  ? ?Financial Resource Strain: Not on file  ?Food Insecurity: Not on file  ?Transportation Needs: Not on file  ?Physical Activity: Not on file  ?Stress: Not on file  ?Social Connections: Not on file  ?Intimate Partner Violence: Not on file  ? ? ?Review of Systems  See HPI.  ? ?PHYSICAL EXAMINATION:   ? ?BP 124/82   Pulse 99   LMP 06/04/2021 (Approximate)   SpO2 96%     ?General appearance: alert, cooperative and appears stated age ? ?Colposcopy - cervix, vagina, and vulva.  ?Consent for procedure.  ?3% acetic acid used in vagina and on cervix. ?White light and green light filter used.  ?Colposcopy satisfactory:  Yes   __x___          No    _____ ?Findings:   large  squamocolumnar junction. ?Cervix:  Acetowhite area linear at 6:00, island of acetowhite change at 10:00.  ?Vagina:  no lesions ?Biopsies:   ECC, biopsy at 6:00, biopsy at 10:00. ?Monsel's placed.  ?Minimal EBL. ?No complications.  ? ?Chaperone was present for exam:  Ladona Ridgel, CMA ? ?ASSESSMENT ? ?LGSIL pap, positive HR HPV.  ? ?PLAN ? ?We discussed abnormal paps, HPV, colposcopy, and LEEP.  ?Fu biopsy results.  ?Start Gardasil vaccine today. ?  ?An After Visit Summary was printed and given to the patient. ? ? ? ?

## 2021-06-18 NOTE — Telephone Encounter (Signed)
Contraception coverage medical necessity letter appeal letter faxed [phone] ?

## 2021-06-18 NOTE — Telephone Encounter (Signed)
[employer] denied Phexxi Rx.  ? ?Medication is not a covered because it is not on the listing or formulary of approved drugs for your plan benefit. Please discuss alternative drug therapy with your doctor. ? ?The request for coverage for PHEXXI GEL, use as directed (180 per month), is denied. This decision is ?based on health plan criteria for PHEXXI GEL. This medicine is covered only if: ?All of the following: ?(1) You have a history of failure, contraindication, or intolerance to nonoxynol-9 based spermicide. ?(2) You have failed or cannot use over-the-counter spermicides. ?The information provided does not show that you meet the criteria listed above. ?Reviewed by: Rene Kocher.Ph. ?

## 2021-06-25 ENCOUNTER — Encounter: Payer: Self-pay | Admitting: Obstetrics and Gynecology

## 2021-06-25 ENCOUNTER — Ambulatory Visit (INDEPENDENT_AMBULATORY_CARE_PROVIDER_SITE_OTHER): Payer: No Typology Code available for payment source | Admitting: Obstetrics and Gynecology

## 2021-06-25 ENCOUNTER — Other Ambulatory Visit (HOSPITAL_COMMUNITY)
Admission: RE | Admit: 2021-06-25 | Discharge: 2021-06-25 | Disposition: A | Discharge status: Home / Self Care | Payer: No Typology Code available for payment source | Source: Ambulatory Visit | Attending: Obstetrics and Gynecology | Admitting: Obstetrics and Gynecology

## 2021-06-25 VITALS — BP 124/82 | HR 99

## 2021-06-25 DIAGNOSIS — R87612 Low grade squamous intraepithelial lesion on cytologic smear of cervix (LGSIL): Secondary | ICD-10-CM | POA: Insufficient documentation

## 2021-06-25 DIAGNOSIS — Z23 Encounter for immunization: Secondary | ICD-10-CM

## 2021-06-25 DIAGNOSIS — Z01812 Encounter for preprocedural laboratory examination: Secondary | ICD-10-CM | POA: Diagnosis not present

## 2021-06-25 LAB — PREGNANCY, URINE: Preg Test, Ur: NEGATIVE

## 2021-06-25 NOTE — Patient Instructions (Signed)
Colposcopy, Care After  The following information offers guidance on how to care for yourself after your procedure. Your doctor may also give you more specific instructions. If you have problems or questions, contact your doctor. What can I expect after the procedure? If you did not have a sample of your tissue taken out (did not have a biopsy), you may only have some spotting of blood for a few days. You can go back to your normal activities. If you had a sample of your tissue taken out, it is common to have: Soreness and mild pain. These may last for a few days. Mild bleeding or fluid (discharge) coming from your vagina. The fluid will look dark and grainy. You may have this for a few days. The fluid may be caused by a liquid that was used during your procedure. You may need to wear a sanitary pad. Spotting of blood for at least 48 hours after the procedure. Follow these instructions at home: Medicines Take over-the-counter and prescription medicines only as told by your doctor. Ask your doctor what over-the-counter pain medicines and prescription medicines you can start taking again. This is very important if you take blood thinners. Activity For at least 3 days, or for as long as told by your doctor, avoid: Douching. Using tampons. Having sex. Return to your normal activities as told by your doctor. Ask your doctor what activities are safe for you. General instructions Ask your doctor if you may take baths, swim, or use a hot tub. You may take showers. If you use birth control (contraception), keep using it. Keep all follow-up visits. Contact a doctor if: You have a fever or chills. You faint or feel light-headed. Get help right away if: You bleed a lot from your vagina. A lot of bleeding means that the bleeding soaks through a pad in less than 1 hour. You have clumps of blood (blood clots) coming from your vagina. You have signs that could mean you have an infection. This may be  fluid coming from your vagina that is: Different than normal. Yellow. Bad-smelling. You have very bad pain or cramps in your lower belly that do not get better with medicine. Summary If you did not have a sample of your tissue taken out, you may only have some spotting of blood for a few days. You can go back to your normal activities. If you had a sample of your tissue taken out, it is common to have mild pain for a few days and spotting for 48 hours. Avoid douching, using tampons, and having sex for at least 3 days after the procedure or for as long as told. Get help right away if you have a lot of bleeding, very bad pain, or signs of infection. This information is not intended to replace advice given to you by your health care provider. Make sure you discuss any questions you have with your health care provider. Document Revised: 06/25/2020 Document Reviewed: 06/25/2020 Elsevier Patient Education  2023 Elsevier Inc.  

## 2021-06-26 LAB — SURGICAL PATHOLOGY

## 2021-08-27 ENCOUNTER — Ambulatory Visit: Payer: No Typology Code available for payment source

## 2021-08-27 NOTE — Progress Notes (Deleted)
Patient in today for 2nd Gardasil injection.   Contraception: Phexxi  LMP: *** Last AEX: 06/13/21 with Dr. Edward Jolly   Injection given in ***. Patient tolerated shot well.   Patient informed next injection due in about 4 months.  Advised patient, if not on birth control, to return for next injection with cycle.   Routed to provider for final review.  Encounter closed.

## 2021-08-29 ENCOUNTER — Ambulatory Visit: Payer: No Typology Code available for payment source

## 2021-08-30 ENCOUNTER — Ambulatory Visit (INDEPENDENT_AMBULATORY_CARE_PROVIDER_SITE_OTHER): Payer: No Typology Code available for payment source | Admitting: *Deleted

## 2021-08-30 DIAGNOSIS — Z23 Encounter for immunization: Secondary | ICD-10-CM | POA: Diagnosis not present

## 2022-01-01 ENCOUNTER — Ambulatory Visit (INDEPENDENT_AMBULATORY_CARE_PROVIDER_SITE_OTHER): Payer: No Typology Code available for payment source | Admitting: *Deleted

## 2022-01-01 DIAGNOSIS — Z23 Encounter for immunization: Secondary | ICD-10-CM | POA: Diagnosis not present

## 2023-02-12 NOTE — L&D Delivery Note (Signed)
 Delivery Note:   G2P1001 at [redacted]w[redacted]d  Admitting diagnosis: Vaginal bleeding in pregnancy, third trimester [O46.93] Preeclampsia, severe [O14.10] Risks: Suspected abruption; PEC with severe features; AMA; GBS presumptive negative but treated with PCN due to preterm;  Onset of labor: 10/13/2023 at 0035, clear fluid IOL/Augmentation: AROM, Pitocin , and IP Foley ROM: 10/13/2023 at 0035  Complete dilation at 10/13/2023 0910 Onset of pushing at 0915 FHR second stage Cat I  Analgesia/Anesthesia intrapartum:None Pushing in hands and knees position with CNM and L&D staff support. Partner, [name], mother, [name], and doula, [name], present for birth and supportive.  Delivery of a Live born female  Birth Weight:  pending APGAR: 6, 9   Newborn Delivery   Birth date/time: 10/13/2023 09:17:00 Delivery type: Vaginal, Spontaneous    in cephalic presentation, position OA to ROA.  APGAR:1 min-6 , 5 min-9   Nuchal Cord: Yes  x 1 Cord double clamped and cut by CNM.  Collection of cord blood for typing completed. Arterial cord blood sample-No   Placenta delivered-Spontaneous with 3 vessels. Uterotonics: Pitocin , TXA Placenta to patient per request Uterine tone firm  Bleeding scant  None laceration identified.  Episiotomy:None Local analgesia: N/A  Repair: N/A Est. Blood Loss (mL): 50.00 Complications: Placental Abruption  Mom to postpartum. Baby [name] to Couplet care / Skin to Skin.  Delivery Report:   Review the Delivery Report for details.    [name], CNM, MSN 10/13/2023, 9:34 AM

## 2023-03-10 ENCOUNTER — Encounter (HOSPITAL_COMMUNITY): Payer: Self-pay | Admitting: *Deleted

## 2023-03-10 ENCOUNTER — Inpatient Hospital Stay (HOSPITAL_COMMUNITY)
Admission: AD | Admit: 2023-03-10 | Discharge: 2023-03-10 | Disposition: A | Discharge status: Home / Self Care | Payer: Medicaid Other | Attending: Obstetrics and Gynecology | Admitting: Obstetrics and Gynecology

## 2023-03-10 ENCOUNTER — Inpatient Hospital Stay (HOSPITAL_COMMUNITY): Payer: Medicaid Other

## 2023-03-10 DIAGNOSIS — O3680X0 Pregnancy with inconclusive fetal viability, not applicable or unspecified: Secondary | ICD-10-CM

## 2023-03-10 DIAGNOSIS — O209 Hemorrhage in early pregnancy, unspecified: Secondary | ICD-10-CM

## 2023-03-10 DIAGNOSIS — Z3A01 Less than 8 weeks gestation of pregnancy: Secondary | ICD-10-CM | POA: Diagnosis not present

## 2023-03-10 DIAGNOSIS — Z87891 Personal history of nicotine dependence: Secondary | ICD-10-CM | POA: Diagnosis not present

## 2023-03-10 LAB — COMPREHENSIVE METABOLIC PANEL
ALT: 15 U/L (ref 0–44)
AST: 23 U/L (ref 15–41)
Albumin: 3.5 g/dL (ref 3.5–5.0)
Alkaline Phosphatase: 39 U/L (ref 38–126)
Anion gap: 8 (ref 5–15)
BUN: 10 mg/dL (ref 6–20)
CO2: 20 mmol/L — ABNORMAL LOW (ref 22–32)
Calcium: 9 mg/dL (ref 8.9–10.3)
Chloride: 109 mmol/L (ref 98–111)
Creatinine, Ser: 0.79 mg/dL (ref 0.44–1.00)
GFR, Estimated: >60 mL/min (ref >60)
Glucose, Bld: 89 mg/dL (ref 70–99)
Potassium: 4 mmol/L (ref 3.5–5.1)
Sodium: 137 mmol/L (ref 135–145)
Total Bilirubin: 0.6 mg/dL (ref 0.0–1.2)
Total Protein: 6.6 g/dL (ref 6.5–8.1)

## 2023-03-10 LAB — URINALYSIS, ROUTINE W REFLEX MICROSCOPIC
Bilirubin Urine: NEGATIVE
Glucose, UA: NEGATIVE mg/dL
Hgb urine dipstick: NEGATIVE
Ketones, ur: NEGATIVE mg/dL
Leukocytes,Ua: NEGATIVE
Nitrite: NEGATIVE
Protein, ur: NEGATIVE mg/dL
Specific Gravity, Urine: 1.03 (ref 1.005–1.030)
pH: 5 (ref 5.0–8.0)

## 2023-03-10 LAB — WET PREP, GENITAL
Clue Cells Wet Prep HPF POC: NONE SEEN
Sperm: NONE SEEN
Trich, Wet Prep: NONE SEEN
WBC, Wet Prep HPF POC: <10 (ref <10)
Yeast Wet Prep HPF POC: NONE SEEN

## 2023-03-10 LAB — CBC
HCT: 36.9 % (ref 36.0–46.0)
Hemoglobin: 12.6 g/dL (ref 12.0–15.0)
MCH: 30.4 pg (ref 26.0–34.0)
MCHC: 34.1 g/dL (ref 30.0–36.0)
MCV: 88.9 fL (ref 80.0–100.0)
Platelets: 295 10*3/uL (ref 150–400)
RBC: 4.15 MIL/uL (ref 3.87–5.11)
RDW: 12.7 % (ref 11.5–15.5)
WBC: 10.8 10*3/uL — ABNORMAL HIGH (ref 4.0–10.5)
nRBC: 0 % (ref 0.0–0.2)

## 2023-03-10 LAB — HCG, QUANTITATIVE, PREGNANCY: hCG, Beta Chain, Quant, S: 2245 m[IU]/mL — ABNORMAL HIGH (ref <5)

## 2023-03-10 LAB — POCT PREGNANCY, URINE: Preg Test, Ur: POSITIVE — AB

## 2023-03-10 NOTE — MAU Provider Note (Signed)
History     [id number]  Arrival date and time: 03/10/23 1425    Chief Complaint  Patient presents with   Abdominal Pain   Vaginal Bleeding     HPI [name] is a 38 y.o. at [redacted]w[redacted]d by unsure LMP, who presents for pelvic pain and vaginal spotting.   Patient reports she found out she was unexpectedly pregnant last week Since then has been having intermittent low pelvic cramping and occasional spotting when using the bathroom No burning or pain with urination No vaginal discharge   --/--/A POS Performed at [facility], [address], [place], [place] [address]  (01/27 1606)  OB History     Gravida  2   Para      Term      Preterm      AB      Living  1      SAB      IAB      Ectopic      Multiple      Live Births  1           Past Medical History:  Diagnosis Date   Crohn's colitis (HCC)     No past surgical history on file.  Family History  Problem Relation Age of Onset   Colon cancer Mother        age at onset 41   Hypertension Mother     Social History   Socioeconomic History   Marital status: Single    Spouse name: Not on file   Number of children: Not on file   Years of education: Not on file   Highest education level: Not on file  Occupational History   Not on file  Tobacco Use   Smoking status: Former    Types: Cigarettes   Smokeless tobacco: Never  Substance and Sexual Activity   Alcohol use: No   Drug use: No   Sexual activity: Yes    Partners: Male    Birth control/protection: None  Other Topics Concern   Not on file  Social History Narrative   Not on file   Social Drivers of Health   Financial Resource Strain: Low Risk  (03/04/2023)   Received from [facility]   Overall Financial Resource Strain (CARDIA)    Difficulty of Paying Living Expenses: Not hard at all  Food Insecurity: No Food Insecurity (03/04/2023)   Received from [facility]   Hunger Vital Sign    Worried About Running Out of  Food in the Last Year: Never true    Ran Out of Food in the Last Year: Never true  Transportation Needs: No Transportation Needs (03/04/2023)   Received from [facility] - Transportation    Lack of Transportation (Medical): No    Lack of Transportation (Non-Medical): No  Physical Activity: Not on file  Stress: Not on file  Social Connections: Unknown (06/11/2021)   Received from [facility]   Social Network    Social Network: Not on file  Intimate Partner Violence: Unknown (06/11/2021)   Received from [facility]   HITS    Physically Hurt: Not on file    Insult or Talk Down To: Not on file    Threaten Physical Harm: Not on file    Scream or Curse: Not on file    No Known Allergies  No current facility-administered medications on file prior to encounter.   Current Outpatient Medications on File Prior to Encounter  Medication Sig Dispense Refill   Multiple Vitamin (MULTIVITAMIN) capsule Take 1 capsule by mouth daily.     Cholecalciferol 1.25 MG (50000 UT) capsule Take by mouth. (Patient not taking: Reported on 03/10/2023)     Lactic Ac-Citric Ac-Pot Bitart (PHEXXI) 1.8-1-0.4 % GEL Place 5 g vaginally daily. As needed (Patient not taking: Reported on 03/10/2023) 180 g 11   VITAMIN E PO Take by mouth. (Patient not taking: Reported on 03/10/2023)       ROS Pertinent positives and negative per HPI, all others reviewed and negative  Physical Exam   BP 132/75   Pulse 98   Temp 98.6 F (37 C)   Resp 18   Ht 5\' 8"  (1.727 m)   Wt 101.2 kg   LMP 01/26/2023 (Within Weeks)   BMI 33.91 kg/m   Patient Vitals for the past 24 hrs:  BP Temp Pulse Resp Height Weight  03/10/23 1548 132/75 98.6 F (37 C) 98 18 5\' 8"  (1.727 m) 101.2 kg    Physical Exam Vitals reviewed.  Constitutional:      General: She is not in acute distress.    Appearance: She is well-developed. She is not diaphoretic.  Eyes:     General: No scleral icterus. Pulmonary:     Effort: Pulmonary  effort is normal. No respiratory distress.  Abdominal:     General: There is no distension.     Palpations: Abdomen is soft.     Tenderness: There is no abdominal tenderness. There is no guarding or rebound.  Skin:    General: Skin is warm and dry.  Neurological:     Mental Status: She is alert.     Coordination: Coordination normal.      Cervical Exam    Bedside Ultrasound Pt informed that the ultrasound is considered a limited OB ultrasound and is not intended to be a complete ultrasound exam.  Patient also informed that the ultrasound is not being completed with the intent of assessing for fetal or placental anomalies or any pelvic abnormalities.  Explained that the purpose of today's ultrasound is to assess for  viability.  Patient acknowledges the purpose of the exam and the limitations of the study.      My interpretation: possible IUGS but very small and not well visualized. No large adnexal masses appreciated.    Labs Results for orders placed or performed during the hospital encounter of 03/10/23 (from the past 24 hours)  Pregnancy, urine POC     Status: Abnormal   Collection Time: 03/10/23  3:39 PM  Result Value Ref Range   Preg Test, Ur POSITIVE (A) NEGATIVE  Urinalysis, Routine w reflex microscopic -Urine, Clean Catch     Status: Abnormal   Collection Time: 03/10/23  3:51 PM  Result Value Ref Range   Color, Urine YELLOW YELLOW   APPearance HAZY (A) CLEAR   Specific Gravity, Urine 1.030 1.005 - 1.030   pH 5.0 5.0 - 8.0   Glucose, UA NEGATIVE NEGATIVE mg/dL   Hgb urine dipstick NEGATIVE NEGATIVE   Bilirubin Urine NEGATIVE NEGATIVE   Ketones, ur NEGATIVE NEGATIVE mg/dL   Protein, ur NEGATIVE NEGATIVE mg/dL   Nitrite NEGATIVE NEGATIVE   Leukocytes,Ua NEGATIVE NEGATIVE  Wet prep, genital     Status: None   Collection Time: 03/10/23  3:54 PM   Specimen: PATH Cytology Cervicovaginal Ancillary Only  Result Value Ref Range   Yeast Wet Prep HPF POC NONE SEEN NONE SEEN    Trich, Wet Prep NONE  SEEN NONE SEEN   Clue Cells Wet Prep HPF POC NONE SEEN NONE SEEN   WBC, Wet Prep HPF POC <10 <10   Sperm NONE SEEN   CBC     Status: Abnormal   Collection Time: 03/10/23  4:06 PM  Result Value Ref Range   WBC 10.8 (H) 4.0 - 10.5 K/uL   RBC 4.15 3.87 - 5.11 MIL/uL   Hemoglobin 12.6 12.0 - 15.0 g/dL   HCT 16.1 09.6 - 04.5 %   MCV 88.9 80.0 - 100.0 fL   MCH 30.4 26.0 - 34.0 pg   MCHC 34.1 30.0 - 36.0 g/dL   RDW 40.9 81.1 - 91.4 %   Platelets 295 150 - 400 K/uL   nRBC 0.0 0.0 - 0.2 %  Comprehensive metabolic panel     Status: Abnormal   Collection Time: 03/10/23  4:06 PM  Result Value Ref Range   Sodium 137 135 - 145 mmol/L   Potassium 4.0 3.5 - 5.1 mmol/L   Chloride 109 98 - 111 mmol/L   CO2 20 (L) 22 - 32 mmol/L   Glucose, Bld 89 70 - 99 mg/dL   BUN 10 6 - 20 mg/dL   Creatinine, Ser 7.82 0.44 - 1.00 mg/dL   Calcium 9.0 8.9 - 95.6 mg/dL   Total Protein 6.6 6.5 - 8.1 g/dL   Albumin 3.5 3.5 - 5.0 g/dL   AST 23 15 - 41 U/L   ALT 15 0 - 44 U/L   Alkaline Phosphatase 39 38 - 126 U/L   Total Bilirubin 0.6 0.0 - 1.2 mg/dL   GFR, Estimated >21 >30 mL/min   Anion gap 8 5 - 15  hCG, quantitative, pregnancy     Status: Abnormal   Collection Time: 03/10/23  4:06 PM  Result Value Ref Range   hCG, Beta Chain, Quant, S 2,245 (H) <5 mIU/mL  ABO/Rh     Status: None   Collection Time: 03/10/23  4:06 PM  Result Value Ref Range   ABO/RH(D)      A POS Performed at [facility], [address] [place], [place] [address]     Imaging No results found.  MAU Course  Procedures Lab Orders         Wet prep, genital         Urinalysis, Routine w reflex microscopic -Urine, Clean Catch         CBC         Comprehensive metabolic panel         hCG, quantitative, pregnancy         Pregnancy, urine POC    No orders of the defined types were placed in this encounter.  Imaging Orders         US OB LESS THAN 14 WEEKS WITH OB TRANSVAGINAL         US OB LESS THAN  14 WEEKS WITH OB TRANSVAGINAL      MDM Moderate (Level 3-4)  Assessment and Plan  #Vaginal bleeding in pregnancy, first trimester #[redacted] weeks gestation of pregnancy Formal US reviewed personally, it shows IUGS, effectively ruled out for ectopic. We discussed that vaginal bleeding in the first trimester is common, but she will need an [place] in 14 days or greater to definitively determin viability. --/--/A POS Performed at [facility], [address], [place], [place] [address]  (01/27 1606), rhogam not indicated. We discussed return precautions including crescendo abdominal pain, heavy vaginal bleeding soaking >1 pad/hour,  and fever. Message sent to [facility] to schedule viability [place].   Dispo: discharged to home in stable condition    Venora Maples, MD/MPH 03/10/23 6:16 PM  Allergies as of 03/10/2023   No Known Allergies      Medication List     STOP taking these medications    Cholecalciferol 1.25 MG (50000 UT) capsule   Phexxi 1.8-1-0.4 % Gel Generic drug: Lactic Ac-Citric Ac-Pot Bitart   VITAMIN E PO       TAKE these medications    multivitamin capsule Take 1 capsule by mouth daily.

## 2023-03-10 NOTE — MAU Note (Signed)
.  [name] is a 38 y.o. at Unknown here in MAU reporting: pt unsure when her period was in December. Took pregnancy test last week and it was positive. Has been having spotting and cramping for about a week.   LMP: 01/26/23 (aprox sometime in [place]) Onset of complaint: 1 week Pain score: 7 Vitals:   03/10/23 1548  BP: 132/75  Pulse: 98  Resp: 18  Temp: 98.6 F (37 C)     FHT: n/ a Lab orders placed from triage: u/a UPT, Wet prep GC

## 2023-03-10 NOTE — Discharge Instructions (Signed)
Your ultrasound shows a very early pregnancy. We discussed that vaginal bleeding in the first trimester is common, but that you will need a follow up ultrasound in no less than two weeks to definitively determine the health of your pregnancy. We discussed return precautions including crescendo abdominal pain, heavy vaginal bleeding soaking >1 pad/hour, and fever.

## 2023-03-11 LAB — GC/CHLAMYDIA PROBE AMP (~~LOC~~) NOT AT ARMC
Chlamydia: NEGATIVE
Comment: NEGATIVE
Comment: NORMAL
Neisseria Gonorrhea: NEGATIVE

## 2023-03-11 LAB — ABO/RH: ABO/RH(D): A POS

## 2023-03-14 ENCOUNTER — Ambulatory Visit (HOSPITAL_COMMUNITY): Admission: RE | Admit: 2023-03-14 | Payer: Medicaid Other | Source: Ambulatory Visit

## 2023-03-17 ENCOUNTER — Other Ambulatory Visit: Payer: Medicaid Other

## 2023-03-17 ENCOUNTER — Telehealth: Payer: Self-pay

## 2023-03-17 ENCOUNTER — Other Ambulatory Visit: Payer: Self-pay

## 2023-03-17 DIAGNOSIS — Z3A01 Less than 8 weeks gestation of pregnancy: Secondary | ICD-10-CM

## 2023-03-17 NOTE — Telephone Encounter (Signed)
Called and spoke with patient to confirm knowledge of [place] schedule for 03/27/2022 at 8:00am. Pt was aware and confirmed appointment. Pt had concerns about getting blood work before ultrasound due to her being 38 y/o and haven't been pregnant for the last 19 years. [facility], CNM and agreed to the blood work. Patient transferred to front desk to be put on schedule.   B'Aisha, CMA

## 2023-03-17 NOTE — Telephone Encounter (Signed)
-----   Message from Venora Maples sent at 03/10/2023  6:17 PM EST ----- Regarding: viability Korea Please schedule viability Korea for no sooner than 03/24/2023 to determine viability and confirm dates.   Thanks [employer]

## 2023-03-18 ENCOUNTER — Encounter: Payer: Self-pay | Admitting: Certified Nurse Midwife

## 2023-03-18 LAB — BETA HCG QUANT (REF LAB): hCG Quant: 15406 m[IU]/mL

## 2023-03-24 ENCOUNTER — Telehealth: Payer: Self-pay | Admitting: Family Medicine

## 2023-03-24 NOTE — Telephone Encounter (Signed)
 Patient called in stating she needs a appointment to get her HCG levels checked.

## 2023-03-25 ENCOUNTER — Other Ambulatory Visit: Payer: Medicaid Other

## 2023-03-25 ENCOUNTER — Other Ambulatory Visit: Payer: Self-pay

## 2023-03-25 DIAGNOSIS — O209 Hemorrhage in early pregnancy, unspecified: Secondary | ICD-10-CM

## 2023-03-26 ENCOUNTER — Encounter: Payer: Self-pay | Admitting: Certified Nurse Midwife

## 2023-03-26 LAB — BETA HCG QUANT (REF LAB): hCG Quant: 63056 m[IU]/mL

## 2023-03-28 ENCOUNTER — Ambulatory Visit (HOSPITAL_COMMUNITY)
Admission: RE | Admit: 2023-03-28 | Discharge: 2023-03-28 | Disposition: A | Discharge status: Home / Self Care | Payer: Medicaid Other | Source: Ambulatory Visit | Attending: Family Medicine | Admitting: Family Medicine

## 2023-03-28 DIAGNOSIS — O3680X0 Pregnancy with inconclusive fetal viability, not applicable or unspecified: Secondary | ICD-10-CM | POA: Insufficient documentation

## 2023-03-31 ENCOUNTER — Other Ambulatory Visit (HOSPITAL_COMMUNITY): Payer: Self-pay | Admitting: Family Medicine

## 2023-03-31 ENCOUNTER — Encounter: Payer: Self-pay | Admitting: Family Medicine

## 2023-04-01 NOTE — Telephone Encounter (Signed)
Called pt and pt stated her concern is addressed.    [name]  04/01/23

## 2023-04-08 ENCOUNTER — Emergency Department (HOSPITAL_COMMUNITY)
Admission: EM | Admit: 2023-04-08 | Discharge: 2023-04-08 | Discharge status: Against Medical Advice | Payer: Medicaid Other | Attending: Emergency Medicine | Admitting: Emergency Medicine

## 2023-04-08 ENCOUNTER — Other Ambulatory Visit: Payer: Self-pay

## 2023-04-08 ENCOUNTER — Telehealth: Payer: No Typology Code available for payment source

## 2023-04-08 ENCOUNTER — Encounter (HOSPITAL_COMMUNITY): Payer: Self-pay | Admitting: Emergency Medicine

## 2023-04-08 DIAGNOSIS — Z5321 Procedure and treatment not carried out due to patient leaving prior to being seen by health care provider: Secondary | ICD-10-CM | POA: Insufficient documentation

## 2023-04-08 DIAGNOSIS — M25552 Pain in left hip: Secondary | ICD-10-CM | POA: Diagnosis not present

## 2023-04-08 DIAGNOSIS — Z3A09 9 weeks gestation of pregnancy: Secondary | ICD-10-CM | POA: Diagnosis not present

## 2023-04-08 DIAGNOSIS — O26891 Other specified pregnancy related conditions, first trimester: Secondary | ICD-10-CM | POA: Diagnosis present

## 2023-04-08 DIAGNOSIS — M5432 Sciatica, left side: Secondary | ICD-10-CM | POA: Insufficient documentation

## 2023-04-08 NOTE — ED Triage Notes (Signed)
 Patient c/o left hip pain radiating down left leg since yesterday.  Patient is currently [redacted] weeks pregnant.  Patient reports history of same where she did not seek treatment.  Patient states now, she is unable to do heavy work due to the pain.

## 2023-04-09 LAB — OB RESULTS CONSOLE HIV ANTIBODY (ROUTINE TESTING): HIV: NONREACTIVE

## 2023-04-09 LAB — OB RESULTS CONSOLE RPR: RPR: NONREACTIVE

## 2023-04-09 LAB — OB RESULTS CONSOLE HEPATITIS B SURFACE ANTIGEN: Hepatitis B Surface Ag: NEGATIVE

## 2023-04-15 ENCOUNTER — Encounter: Payer: Self-pay | Admitting: Obstetrics and Gynecology

## 2023-05-19 ENCOUNTER — Other Ambulatory Visit: Payer: Self-pay | Admitting: Obstetrics and Gynecology

## 2023-05-19 DIAGNOSIS — O09511 Supervision of elderly primigravida, first trimester: Secondary | ICD-10-CM

## 2023-05-20 ENCOUNTER — Inpatient Hospital Stay (HOSPITAL_COMMUNITY)
Admission: AD | Admit: 2023-05-20 | Discharge: 2023-05-20 | Disposition: A | Discharge status: Home / Self Care | Attending: Obstetrics and Gynecology | Admitting: Obstetrics and Gynecology

## 2023-05-20 DIAGNOSIS — M25551 Pain in right hip: Secondary | ICD-10-CM | POA: Diagnosis present

## 2023-05-20 DIAGNOSIS — M533 Sacrococcygeal disorders, not elsewhere classified: Secondary | ICD-10-CM | POA: Diagnosis present

## 2023-05-20 DIAGNOSIS — O26892 Other specified pregnancy related conditions, second trimester: Secondary | ICD-10-CM

## 2023-05-20 DIAGNOSIS — M5431 Sciatica, right side: Secondary | ICD-10-CM | POA: Diagnosis not present

## 2023-05-20 DIAGNOSIS — M5432 Sciatica, left side: Secondary | ICD-10-CM | POA: Insufficient documentation

## 2023-05-20 DIAGNOSIS — O99891 Other specified diseases and conditions complicating pregnancy: Secondary | ICD-10-CM | POA: Diagnosis not present

## 2023-05-20 DIAGNOSIS — Z3A14 14 weeks gestation of pregnancy: Secondary | ICD-10-CM | POA: Diagnosis not present

## 2023-05-20 DIAGNOSIS — M5459 Other low back pain: Secondary | ICD-10-CM | POA: Diagnosis present

## 2023-05-20 MED ORDER — METHOCARBAMOL 500 MG PO TABS: 500.0000 mg | ORAL_TABLET | Freq: Two times a day (BID) | ORAL | 0 refills | Status: DC

## 2023-05-20 NOTE — MAU Provider Note (Signed)
  S Ms. SOLEI WUBBEN is a 38 y.o. G2P0 patient who presents to MAU today with complaint of b/l sciatic pain which is more on her right sis today. She has been using Flexeril, Tylenol, Lidoderm patches, and heating pads with limited relief. She has spoke with her Primary OB who is planning to set up PT for her. She denies any GI/GU complaints  She offers no OB c/o. Denies LOF, VB, Cramping.    Review of Systems  All other systems reviewed and are negative.   Unless otherwise noted in HPI O BP (!) 146/83 (BP Location: Right Arm)   Pulse (!) 126   Temp 98.4 F (36.9 C) (Oral)   Resp 16   LMP 01/26/2023 (Within Weeks)   SpO2 100%  Physical Exam Vitals and nursing note reviewed.  Constitutional:      Appearance: Normal appearance.  HENT:     Head: Normocephalic.     Mouth/Throat:     Mouth: Mucous membranes are moist.  Cardiovascular:     Rate and Rhythm: Normal rate and regular rhythm.  Pulmonary:     Effort: Pulmonary effort is normal.     Breath sounds: Normal breath sounds.  Abdominal:     Palpations: Abdomen is soft.  Musculoskeletal:        General: Normal range of motion.     Cervical back: Normal range of motion.     Lumbar back: Spasms present.     Comments: B/L Sciatic pain lower hips B/L and pain that radiates down her legs- sometimes causes difficulty ambulating   Skin:    General: Skin is warm.  Neurological:     Mental Status: She is alert and oriented to person, place, and time.  Psychiatric:        Mood and Affect: Mood normal.        Behavior: Behavior normal.   FHR 159 via Doppler  MDM  HIGH  Repeat BP at discharge 124/81  I have reviewed the patient chart and performed the physical exam .Medications ordered as stated below.  A/P as described below.  Counseling and education provided and patient agreeable  with plan as described below. Verbalized understanding.    ASSESSMENT Medical screening exam complete  1. Bilateral sciatica  (Primary)  2. [redacted] weeks gestation of pregnancy   Meds ordered this encounter  Medications   methocarbamol (ROBAXIN) 500 MG tablet    Sig: Take 1 tablet (500 mg total) by mouth 2 (two) times daily.    Dispense:  20 tablet    Refill:  0    Supervising Provider:   Samara Snide    PLAN Discharge from MAU in stable condition See AVS for full description of information and education provided to the patient at discharge Patient given the option of transfer to [facility] for further evaluation or seek care in outpatient facility of choice  List of options for follow-up given  Warning signs for worsening condition that would warrant emergency follow-up discussed Patient may return to MAU as needed   Colman Cater, NP 05/20/2023 11:00 AM

## 2023-05-20 NOTE — MAU Note (Signed)
...  [name] is a 38 y.o. at [redacted]w[redacted]d here in MAU reporting: She reports sciatic pain that has now transferred to her right side. She reports she was prescribed Flexeril and took it yesterday but it did not help. She reports initially the pain has just been on her left side. She reports over the weekend while sitting at a conference for around three hours, her right side of her hip and leg began hurting. She reports she was referred to an Orthopaedic Doctor but he told her because she did not have a "drop foot" he could not do anything for her since she was pregnant. Denies VB or LOF.   Next OB appointment on the 15th.  Office visit noted in epic on 2/25: -Sciatica of left side -sent home with Lidoderm patches, told about Tylenol, heating/pad and warm compresses  Onset of complaint: One month+ Pain score: 10/10 left and right hip and leg pain  Vitals:   05/20/23 0947  BP: (!) 146/83  Pulse: (!) 126  Resp: 16  Temp: 98.4 F (36.9 C)  SpO2: 100%     FHT: 159 doppler Lab orders placed from triage: none

## 2023-05-20 NOTE — Discharge Instructions (Signed)

## 2023-06-09 ENCOUNTER — Inpatient Hospital Stay (HOSPITAL_COMMUNITY)
Admission: AD | Admit: 2023-06-09 | Discharge: 2023-06-09 | Disposition: A | Discharge status: Home / Self Care | Attending: Obstetrics & Gynecology | Admitting: Obstetrics & Gynecology

## 2023-06-09 ENCOUNTER — Encounter (HOSPITAL_COMMUNITY): Payer: Self-pay | Admitting: Obstetrics & Gynecology

## 2023-06-09 DIAGNOSIS — K5090 Crohn's disease, unspecified, without complications: Secondary | ICD-10-CM | POA: Insufficient documentation

## 2023-06-09 DIAGNOSIS — O99012 Anemia complicating pregnancy, second trimester: Secondary | ICD-10-CM | POA: Insufficient documentation

## 2023-06-09 DIAGNOSIS — Z3A17 17 weeks gestation of pregnancy: Secondary | ICD-10-CM | POA: Diagnosis not present

## 2023-06-09 DIAGNOSIS — M79605 Pain in left leg: Secondary | ICD-10-CM | POA: Insufficient documentation

## 2023-06-09 DIAGNOSIS — M7918 Myalgia, other site: Secondary | ICD-10-CM | POA: Insufficient documentation

## 2023-06-09 DIAGNOSIS — G629 Polyneuropathy, unspecified: Secondary | ICD-10-CM | POA: Insufficient documentation

## 2023-06-09 DIAGNOSIS — I8393 Asymptomatic varicose veins of bilateral lower extremities: Secondary | ICD-10-CM | POA: Diagnosis not present

## 2023-06-09 DIAGNOSIS — M79604 Pain in right leg: Secondary | ICD-10-CM | POA: Diagnosis not present

## 2023-06-09 DIAGNOSIS — O26892 Other specified pregnancy related conditions, second trimester: Secondary | ICD-10-CM | POA: Insufficient documentation

## 2023-06-09 LAB — CBC WITH DIFFERENTIAL/PLATELET
Abs Immature Granulocytes: 0.05 10*3/uL (ref 0.00–0.07)
Basophils Absolute: 0 10*3/uL (ref 0.0–0.1)
Basophils Relative: 0 %
Eosinophils Absolute: 0.2 10*3/uL (ref 0.0–0.5)
Eosinophils Relative: 2 %
HCT: 33.1 % — ABNORMAL LOW (ref 36.0–46.0)
Hemoglobin: 11.5 g/dL — ABNORMAL LOW (ref 12.0–15.0)
Immature Granulocytes: 1 %
Lymphocytes Relative: 17 %
Lymphs Abs: 1.8 10*3/uL (ref 0.7–4.0)
MCH: 30.8 pg (ref 26.0–34.0)
MCHC: 34.7 g/dL (ref 30.0–36.0)
MCV: 88.7 fL (ref 80.0–100.0)
Monocytes Absolute: 0.6 10*3/uL (ref 0.1–1.0)
Monocytes Relative: 6 %
Neutro Abs: 7.9 10*3/uL — ABNORMAL HIGH (ref 1.7–7.7)
Neutrophils Relative %: 74 %
Platelets: 261 10*3/uL (ref 150–400)
RBC: 3.73 MIL/uL — ABNORMAL LOW (ref 3.87–5.11)
RDW: 12.9 % (ref 11.5–15.5)
WBC: 10.4 10*3/uL (ref 4.0–10.5)
nRBC: 0 % (ref 0.0–0.2)

## 2023-06-09 LAB — BASIC METABOLIC PANEL WITH GFR
Anion gap: 10 (ref 5–15)
BUN: 10 mg/dL (ref 6–20)
CO2: 19 mmol/L — ABNORMAL LOW (ref 22–32)
Calcium: 9.4 mg/dL (ref 8.9–10.3)
Chloride: 104 mmol/L (ref 98–111)
Creatinine, Ser: 0.55 mg/dL (ref 0.44–1.00)
GFR, Estimated: >60 mL/min (ref >60)
Glucose, Bld: 103 mg/dL — ABNORMAL HIGH (ref 70–99)
Potassium: 3.8 mmol/L (ref 3.5–5.1)
Sodium: 133 mmol/L — ABNORMAL LOW (ref 135–145)

## 2023-06-09 LAB — IRON AND TIBC
Iron: 94 ug/dL (ref 28–170)
Saturation Ratios: 20 % (ref 10.4–31.8)
TIBC: 461 ug/dL — ABNORMAL HIGH (ref 250–450)
UIBC: 367 ug/dL

## 2023-06-09 LAB — VITAMIN B12: Vitamin B-12: 407 pg/mL (ref 180–914)

## 2023-06-09 NOTE — MAU Provider Note (Signed)
 History     CSN: [id number]  Arrival date and time: 06/09/23 1129   None     Chief Complaint  Patient presents with   Leg Pain   Patient presents with ongoing bilateral leg pain. She was seen here previously on 4/08 for the same complaint, it was recommended that she see PT at that time. She had her PT appointment today, and they did not feel her pain was due to sciatica, but suggested it could be due to a circulatory issue or deficiency of some kind. She returns today requesting further evaluation and work up. She denies weakness, loss of sensation, but does report intermittent burning and squeezing in her calves and and ankles. She also reports a restless feeling, like she needs to constantly move and adjust to be comfortable. Occasionally she has some pain in her thighs, but usually at night when she rolls over. No bleeding, leaking or loss of fluids. Denies HA, chest pain or shortness of breath.  Not a current smoker, no recent travel by plane or >3 hours in a car, no personal or family history of clots.   Leg Pain     OB History     Gravida  2   Para      Term      Preterm      AB      Living  1      SAB      IAB      Ectopic      Multiple      Live Births  1           Past Medical History:  Diagnosis Date   Crohn's colitis (HCC)     No past surgical history on file.  Family History  Problem Relation Age of Onset   Colon cancer Mother        age at onset 47   Hypertension Mother     Social History   Tobacco Use   Smoking status: Former    Types: Cigarettes   Smokeless tobacco: Never  Substance Use Topics   Alcohol use: No   Drug use: No    Allergies: No Known Allergies  Medications Prior to Admission  Medication Sig Dispense Refill Last Dose/Taking   methocarbamol  (ROBAXIN ) 500 MG tablet Take 1 tablet (500 mg total) by mouth 2 (two) times daily. 20 tablet 0    Multiple Vitamin (MULTIVITAMIN) capsule Take 1 capsule by mouth daily.        Review of Systems  Respiratory:  Negative for shortness of breath.   Cardiovascular:  Negative for chest pain and leg swelling.  Musculoskeletal:  Positive for myalgias. Negative for arthralgias, gait problem and joint swelling.  Skin:  Negative for color change.   Physical Exam   Blood pressure 136/84, pulse (!) 116, temperature 98.3 F (36.8 C), temperature source Oral, resp. rate 18, height 5\' 8"  (1.727 m), weight 115.1 kg, last menstrual period 01/26/2023, SpO2 98%.  Physical Exam Constitutional:      Appearance: Normal appearance.  Cardiovascular:     Rate and Rhythm: Normal rate and regular rhythm.     Pulses: Normal pulses.  Pulmonary:     Effort: Pulmonary effort is normal.     Breath sounds: Normal breath sounds.  Abdominal:     General: Abdomen is flat. Bowel sounds are normal.     Palpations: Abdomen is soft.  Musculoskeletal:        General: No swelling, tenderness or  deformity. Normal range of motion.     Cervical back: Normal range of motion.     Right lower leg: No edema.     Left lower leg: No edema.     Comments: Negative straight leg raise bilaterally  Skin:    General: Skin is warm and dry.     Findings: No bruising or erythema.  Neurological:     Mental Status: She is alert.     Motor: No weakness.     MAU Course  Procedures  MDM Patient describes leg pain which could be attributed to cramping vs. Neuropathic pain. Reassuringly, she has been seen recently with no red flag symptoms such as weakness, gait disturbance, bowel or bladder incontinence. Will proceed with lab work including CBC, iron study, BMET, Mag, and B12 to look for reversible causes.   Low suspicion for clot given no major risk factors and reassuring physical exam.   Assessment and Plan   Assessment & Plan Bilateral leg pain CBC returned with no findings of anemia. Discussed with patient the use of compression stockings for varicose veins, she would like to try this.  Discussed discharging prior to labs coming back, and patient ultimately elected to discharge before labs were back. Discussed return precautions and expectations of communication when results were in.  -low suspicion for life threatening cause for leg pain given no major risk factors for clot, and reassuring physical exam -stable for discharge from MAU, recommend follow up with primary OB    Rayma Calandra 06/09/2023, 12:06 PM

## 2023-06-09 NOTE — MAU Note (Signed)
 [name] is a 38 y.o. at [redacted]w[redacted]d here in MAU reporting: has ongoing pain in lower legs. "Severe pain".  Had PT appt this morning, was told it is not sciatic related.  Pt is concerned if it a blood clot or some sort of deficiency.  OB was told to contact her primary.  She states can't get anything done, because of the pain, it has been going on for about 2 months.   Onset of complaint: ongoing Pain score: 7 Vitals:   06/09/23 1142  BP: 136/84  Pulse: (!) 116  Resp: 18  Temp: 98.3 F (36.8 C)  SpO2: 98%     FHT:157 Lab orders placed from triage:

## 2023-06-09 NOTE — Assessment & Plan Note (Signed)
 CBC returned with no findings of anemia. Discussed with patient the use of compression stockings for varicose veins, she would like to try this. Discussed discharging prior to labs coming back, and patient ultimately elected to discharge before labs were back. Discussed return precautions and expectations of communication when results were in.  -low suspicion for life threatening cause for leg pain given no major risk factors for clot, and reassuring physical exam -stable for discharge from MAU, recommend follow up with primary OB

## 2023-06-09 NOTE — Discharge Instructions (Signed)
 Thank you for allowing us  to be part of your care!  If you have any abnormal lab values, we will give you a call. Otherwise, you can use the compression socks as discussed. Please follow up with your regular OB according to your previous schedule.

## 2023-06-13 DIAGNOSIS — O99210 Obesity complicating pregnancy, unspecified trimester: Secondary | ICD-10-CM | POA: Insufficient documentation

## 2023-06-13 DIAGNOSIS — O280 Abnormal hematological finding on antenatal screening of mother: Secondary | ICD-10-CM | POA: Insufficient documentation

## 2023-06-13 DIAGNOSIS — O09519 Supervision of elderly primigravida, unspecified trimester: Secondary | ICD-10-CM | POA: Insufficient documentation

## 2023-06-17 ENCOUNTER — Ambulatory Visit

## 2023-06-19 ENCOUNTER — Ambulatory Visit (HOSPITAL_BASED_OUTPATIENT_CLINIC_OR_DEPARTMENT_OTHER): Admitting: Maternal & Fetal Medicine

## 2023-06-19 ENCOUNTER — Other Ambulatory Visit: Payer: Self-pay | Admitting: *Deleted

## 2023-06-19 ENCOUNTER — Ambulatory Visit: Attending: Obstetrics and Gynecology

## 2023-06-19 VITALS — BP 136/66 | HR 105

## 2023-06-19 DIAGNOSIS — Z3689 Encounter for other specified antenatal screening: Secondary | ICD-10-CM | POA: Insufficient documentation

## 2023-06-19 DIAGNOSIS — O99210 Obesity complicating pregnancy, unspecified trimester: Secondary | ICD-10-CM

## 2023-06-19 DIAGNOSIS — E669 Obesity, unspecified: Secondary | ICD-10-CM | POA: Diagnosis not present

## 2023-06-19 DIAGNOSIS — O09511 Supervision of elderly primigravida, first trimester: Secondary | ICD-10-CM | POA: Diagnosis present

## 2023-06-19 DIAGNOSIS — Z362 Encounter for other antenatal screening follow-up: Secondary | ICD-10-CM

## 2023-06-19 DIAGNOSIS — O09512 Supervision of elderly primigravida, second trimester: Secondary | ICD-10-CM | POA: Diagnosis present

## 2023-06-19 DIAGNOSIS — O09522 Supervision of elderly multigravida, second trimester: Secondary | ICD-10-CM

## 2023-06-19 DIAGNOSIS — O280 Abnormal hematological finding on antenatal screening of mother: Secondary | ICD-10-CM | POA: Insufficient documentation

## 2023-06-19 DIAGNOSIS — Z148 Genetic carrier of other disease: Secondary | ICD-10-CM

## 2023-06-19 DIAGNOSIS — Z3A19 19 weeks gestation of pregnancy: Secondary | ICD-10-CM | POA: Diagnosis present

## 2023-06-19 DIAGNOSIS — O99612 Diseases of the digestive system complicating pregnancy, second trimester: Secondary | ICD-10-CM

## 2023-06-19 DIAGNOSIS — O99212 Obesity complicating pregnancy, second trimester: Secondary | ICD-10-CM

## 2023-06-19 DIAGNOSIS — O99213 Obesity complicating pregnancy, third trimester: Secondary | ICD-10-CM

## 2023-06-19 NOTE — Progress Notes (Signed)
 Patient information  Patient Name: [name]  Patient MRN:   [id number]  Referring practice: MFM Referring Provider: [facility] ([facility])  MFM CONSULT  [name] is a 38 y.o. G2P0 at [redacted]w[redacted]d here for ultrasound and consultation. 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 During today's visit we focused on the following concerns:   I discussed the potential complications associated with obesity in pregnancy.  These complications include but are not limited to increased risk of excessive maternal weight gain, fetal growth abnormalities, fetal congenital disorders, inability to visualize fetal anatomic structures on ultrasound, gestational diabetes, hypertensive disorders of pregnancy, operative birth including cesarean delivery or assisted vaginal delivery, delayed wound healing and many long-term health complications.  I discussed the need for continued growth ultrasounds and possibly antenatal testing depending upon how the pregnancy course progresses.  Maternal weight gain should be limited to 10 to 20 pounds during the pregnancy.  While normal weight loss may occur during the first and early second trimester, efforts to actively lose weight with the use of medication is not recommended during pregnancy.  A whole food diet and regular exercise of at least 15 to 30 minutes of moderately strenuous activity is recommended in the absence of any contraindications. Weight loss with the use of medications is not recommended during pregnancy.  I discussed the risk and impact of preeclampsia on her pregnancy and the role of baseline  laboratory assessment of kidney, liver and platelet count as well as the role of low dose Aspirin to the reduce the risk of developing preeclampsia. I reassured the patient that we expect a favorable pregnancy outcome but due to her pregnancy complications she will need a higher level of monitoring for her pregnancy compared to a pregnancy without complications. The patient had time to ask questions that were answered to her satisfaction. She verbalized understanding of our discussion and request to proceed with the plan outlined in the recommendations.   The patient has a history of Crohn's diagnosed at age 25, no recent flairs. She does not take maintenance medication. This is unlikely to complicate her pregnancy but we are glad to provide guidance if she experiences any flares.   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  I encouraged her to have genetic counseling to discuss this test result  Sonographic findings Single intrauterine pregnancy at 19w 0d  Fetal cardiac activity:  Observed and appears normal. Presentation: Breech. The anatomic structures that were well seen appear normal without evidence of soft markers. Due to poor acoustic windows some structures remain suboptimally visualized. Fetal biometry shows the estimated fetal weight at the 89 percentile.  Amniotic fluid: Within normal limits.  MVP: 5.5 cm. Placenta: Posterior. Adnexa: No masses visualized.  Cervical length: 4 cm.  There are limitations of prenatal ultrasound such as the inability to detect certain abnormalities due to poor visualization. Various factors such as fetal position, gestational age and maternal body habitus may increase the difficulty in visualizing the fetal anatomy.    Recommendations -EDD should be 11/13/2023 based on Early Ultrasound at [redacted]w[redacted]d on 03/28/23. -Detailed ultrasound was done today without abnormalities. -Baseline preeclampsia labs: CMP,  CBC and urine protein/creatinine ratio if not  previously completed.  -Continue Aspirin 81 mg for preeclampsia prophylaxis. -Follow-up anatomy and fetal growth in 4 to 6 weeks. -Serial growth ultrasounds starting around 28 weeks to monitor for fetal growth restriction. -Genetic counseling for Fragile X intermediate risk status -Continue routine prenatal care with referring OB provider  Review of Systems: A review of systems was performed and was negative except per HPI   Vitals and Physical Exam    06/19/2023    7:05 AM 06/09/2023   12:30 PM 06/09/2023   12:25 PM  Vitals with BMI  Systolic 136 127 161  Diastolic 66 72 77  Pulse 105 107 105    Sitting comfortably on the sonogram table Nonlabored breathing Normal rate and rhythm Abdomen is nontender  Past pregnancies OB History  Gravida Para Term Preterm AB Living  2     1  SAB IAB Ectopic Multiple Live Births      1    # Outcome Date GA Lbr Len/2nd Weight Sex Type Anes PTL Lv  2 Current           1 Gravida              I spent 30 minutes reviewing the patients chart, including labs and images as well as counseling the patient about her medical conditions. Greater than 50% of the time was spent in direct face-to-face patient counseling.  Penney Bowling, DO Maternal fetal medicine, [place]   06/19/2023  8:41 AM

## 2023-06-24 ENCOUNTER — Telehealth

## 2023-06-24 DIAGNOSIS — Z3A19 19 weeks gestation of pregnancy: Secondary | ICD-10-CM

## 2023-06-24 DIAGNOSIS — O09522 Supervision of elderly multigravida, second trimester: Secondary | ICD-10-CM | POA: Diagnosis not present

## 2023-06-24 DIAGNOSIS — O285 Abnormal chromosomal and genetic finding on antenatal screening of mother: Secondary | ICD-10-CM | POA: Diagnosis not present

## 2023-06-24 NOTE — Progress Notes (Cosign Needed Addendum)
 [facility] at [facility] [address] Phone:  [phone]   Fax:  [phone]      Virtual Visit via Video Note  I connected with  [name] on 06/24/23 at  9:00 AM EDT by a video enabled telemedicine application and verified that I am speaking with the correct person using two identifiers.  Location: Patient: [name]. Provider: Maternal Fetal Care.   Genetic Counseling Clinic Note:   I spoke with 38 y.o. [name] today to discuss her carrier screening results. She was referred by Elwyn Hamper, PA-C.    Pregnancy History:    G2P1001. EGA: [redacted]w[redacted]d by US . EDD: 11/13/2023. Denies personal health concerns. Denies bleeding, infections, and fevers in this pregnancy. Denies using tobacco, alcohol, or street drugs in this pregnancy.   Family History:    A three-generation pedigree was created and scanned into Epic under the Media tab.  [name] reports her 30 yo mother was diagnosed with colon cancer at 36 yo and has kidney disease. Different types of cancer can have a hereditary component that increases an individual's susceptibility to develop cancer; however, most cancers occur by chance due to a combination of genetics and the environment. [name] can inform her PCP about her family history so they can discuss appropriate screening and testing options, including a possible referral to cancer genetic counseling.  [name] reports FOB has a family history of Huntington's disease (HD) in his father and paternal half-brother. No additional information is known. She reports FOB is considering testing but has not made a decision. We briefly reviewed the genetics and inheritance pattern of HD, which is autosomal dominant with anticipation of the paternal allele in most cases. We reviewed that based on the reported family history, FOB has a 50% chance of inheriting HD from his father. If affected, FOB has a 50% chance of passing  HD down to each of his children. We also reviewed that predictive genetic testing for HD in the prenatal or pediatric settings are typically not recommended. However, certain cases such as decision of termination versus continuation of a pregnancy can affect decision-making regarding prenatal diagnostic testing. [name] reports that FOB was referred to have genetic testing at [place], either at [facility] or [place]. [name] was also made aware of [facility] adult genetics clinic that can offer HD testing.  Maternal ethnicity reported as Black and paternal ethnicity reported as Black. Denies Ashkenazi Jewish ancestry.  Family history not remarkable for consanguinity, individuals with birth defects, intellectual disability, autism spectrum disorder, multiple spontaneous abortions, still births, or unexplained neonatal death.   Maternal Intermediate Carrier for Fragile X Syndrome:  Through [facility] carrier screening, [name] was found to carry an intermediate size 47 CGG allele and a normal size 30 CGG allele in her FMR1 genes.   Based on these results, she is not at an increased risk of having a child with fragile X syndrome. In some cases, intermediate size repeat alleles can expand to a premutation in future generations. It is estimated that around 14% of intermediate alleles expand to a premutation allele in future generations.  Individuals who are intermediate allele carriers are not expected to have any additional medical concerns regarding this result. Intermediate sized alleles are typically between 45 and 54 CGG repeats. These individuals have a chance that each of their children may inherit a slightly larger CGG repeat allele. It is possible for intermediate allele carriers to have children  who are premutation carriers. Premutation carriers have between 55 and 200 CGG repeats. Females who are premutation carriers are at risk of having children with fragile X syndrome. Female premutation allele carriers  may exhibit symptoms of fragile X premature ovarian insufficiency (FXPOI), which include early menopause and infertility. Males and females with the premutation alleles may have attention deficit disorder (ADD) or other concerns involving attention, behavior, and social skills. They are also at risk of developing a neurologic condition called fragile X associated tremor/ataxia syndrome (FXTAS) in late adulthood. Therefore, [name]'s children may seek genetic counseling in the future to determine risk of fragile X syndrome to any of their pregnancies.   Charrisse stated that she may consider a referral to pediatric genetics in the future for her son.  We discussed that regarding this current pregnancy, there is a 50% chance that [name] would pass down the intermediate allele. According to current data, there would be an approximately 7% chance that this pregnancy would have the premutation allele.    We briefly discussed amniocentesis including the risks as well as the implications of genetic testing prenatally or during childhood for an adult-onset condition that may be associated with premutation fragile X alleles.    Finally, of the other 13 conditions screened for, [name] was not found to be a carrier. A negative result significantly reduces but does not eliminate the chance that the individual is a carrier. Please see report for details.   Newborn Screening. The [place]  Newborn Screening (NBS) program will screen all newborn babies for cystic fibrosis, spinal muscular atrophy, hemoglobinopathies, and numerous other conditions. This does not screen for fragile X syndrome.  Advanced Maternal Age:  We briefly discussed that the chance that a fetus would be affected with a chromosome difference increases with advanced maternal age. The chance that [name]'s current pregnancy would be affected with a chromosome difference based on her age alone is approximately 1 in 149 (~0.7%). This means that there is a  148 in 149 (~99.3%) chance that the fetus would not be affected with a chromosome difference. We discussed that this risk may also be lower given her low-risk NIPS results and normal anatomy ultrasound.  Previous Testing Completed:  Low risk NIPS: Carlyssa previously completed noninvasive prenatal screening (NIPS) in this pregnancy. The result is low risk, consistent with a female fetus. This screening significantly reduces but does not eliminate the chance that the current pregnancy has Down syndrome (trisomy 6), trisomy 44, trisomy 13, common sex chromosome conditions, and 22q11.2 microdeletion syndrome. Please see report for details. There are many genetic conditions that cannot be detected by NIPS.   Negative ms-AFP screening: Charmin previously completed a maternal serum AFP screen in this pregnancy. The result is screen negative. Please see report for details. A negative result reduces the risk that the current pregnancy has an open neural tube defect. Closed neural tube defects and some open defects may not be detected by this screen.   Plan of Care:   Narcissus may discuss a referral to pediatric genetics with her baby's pediatrician if she desires. Otherwise, there is no increased risk for fragile X in her children. Virdell declined amniocentesis. FOB Lenette Quick can request a referral to an adult genetics clinic for Huntington's disease testing. Testing can be done at [facility] or [facility] clinic. Routine prenatal care.   Informed consent was obtained. All questions were answered.   65 minutes were spent on the date of the encounter in service to the patient  including preparation, consultation through video chat, discussion of test reports and available next steps, pedigree construction, genetic risk assessment, documentation, and care coordination.    Thank you for sharing in the care of Exie with us .  Please do not hesitate to contact us  at [phone] if you have any questions.    Georgean Kindle, MS, [facility] Certified Genetic Counselor   Genetic counseling student involved in appointment: No.  I provided general supervision for this patient and was immediately available for any patient care concerns. [employer]

## 2023-06-26 ENCOUNTER — Ambulatory Visit

## 2023-07-25 ENCOUNTER — Ambulatory Visit (HOSPITAL_BASED_OUTPATIENT_CLINIC_OR_DEPARTMENT_OTHER): Admitting: Maternal & Fetal Medicine

## 2023-07-25 ENCOUNTER — Ambulatory Visit: Attending: Maternal & Fetal Medicine

## 2023-07-25 ENCOUNTER — Other Ambulatory Visit: Payer: Self-pay | Admitting: *Deleted

## 2023-07-25 VITALS — BP 118/74 | HR 97

## 2023-07-25 DIAGNOSIS — K50919 Crohn's disease, unspecified, with unspecified complications: Secondary | ICD-10-CM | POA: Diagnosis present

## 2023-07-25 DIAGNOSIS — O09512 Supervision of elderly primigravida, second trimester: Secondary | ICD-10-CM

## 2023-07-25 DIAGNOSIS — O280 Abnormal hematological finding on antenatal screening of mother: Secondary | ICD-10-CM | POA: Insufficient documentation

## 2023-07-25 DIAGNOSIS — O3660X0 Maternal care for excessive fetal growth, unspecified trimester, not applicable or unspecified: Secondary | ICD-10-CM | POA: Insufficient documentation

## 2023-07-25 DIAGNOSIS — Z3A24 24 weeks gestation of pregnancy: Secondary | ICD-10-CM | POA: Diagnosis present

## 2023-07-25 DIAGNOSIS — O3663X0 Maternal care for excessive fetal growth, third trimester, not applicable or unspecified: Secondary | ICD-10-CM

## 2023-07-25 DIAGNOSIS — E669 Obesity, unspecified: Secondary | ICD-10-CM | POA: Diagnosis not present

## 2023-07-25 DIAGNOSIS — Z362 Encounter for other antenatal screening follow-up: Secondary | ICD-10-CM | POA: Insufficient documentation

## 2023-07-25 DIAGNOSIS — O99212 Obesity complicating pregnancy, second trimester: Secondary | ICD-10-CM | POA: Diagnosis not present

## 2023-07-25 DIAGNOSIS — O99210 Obesity complicating pregnancy, unspecified trimester: Secondary | ICD-10-CM | POA: Insufficient documentation

## 2023-07-25 DIAGNOSIS — O285 Abnormal chromosomal and genetic finding on antenatal screening of mother: Secondary | ICD-10-CM

## 2023-07-25 NOTE — Progress Notes (Signed)
 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 G2P0 at [redacted]w[redacted]d here for ultrasound and consultation. Eulene Hickman is doing well today with no acute concerns. Today we focused on the following:   Hx of Crohn's: The patient has a history of Crohn's diagnosed at age 60, no recent flairs. She does not take maintenance medication. This is unlikely to complicate her pregnancy but we are glad to provide guidance if she experiences any flares.  Fragile X carrier: S/p GC. 50% chance that Milton would pass down the intermediate allele. According to current data, there would be an approximately 7% chance that this pregnancy would have the premutation allele.   LGA: The estimated fetal weight on today's ultrasound is consistent with large for gestational age (LGA).  The patient reports that the father of the baby is a very big man.  I discussed this is likely constitutionally normal growth but may represent an abnormally accelerated growth process.  I discussed the diagnosis, management, and clinical significance of an LGA fetus. Risk factors for LGA include, but are not limited to, constitutional factors, pre-existing or gestational diabetes, maternal pre-pregnancy obesity, excessive gestational weight gain, abnormal fasting or postprandial blood glucose levels, dyslipidemia, a history of prior macrosomic newborns (defined as birth weight over 4000 g), and post-term pregnancy. Racial and ethnic factors may also contribute. Diagnosis is  made via prenatal ultrasound; however, the prediction of neonatal weight remains imprecise, with an estimated variance of up to 20% between the expected and actual birth weight.   The patient had time to ask questions that were answered to her satisfaction.  She verbalized understanding and agrees to proceed with the plan below.  Sonographic findings Single intrauterine pregnancy at 24w 1d.  Fetal cardiac activity:  Observed and appears normal. Presentation: Breech. Interval fetal anatomy appears normal. Fetal biometry shows the estimated fetal weight at the 94 percentile. Amniotic fluid volume: Within normal limits. MVP: 6.45 cm. Placenta: Posterior.  There are limitations of prenatal ultrasound such as the inability to detect certain abnormalities due to poor visualization. Various factors such as fetal position, gestational age and maternal body habitus may increase the difficulty in visualizing the fetal anatomy.    Recommendations - Due to LGA fetus a 6 w f/u US  will be done after the patient GTT  Review of Systems: A review of systems was performed and was negative except per HPI   Vitals and Physical Exam    07/25/2023    8:59 AM 06/19/2023    7:05 AM 06/09/2023   12:30 PM  Vitals with BMI  Systolic 118 136 409  Diastolic 74 66 72  Pulse 97 105 107    Sitting comfortably on the sonogram table Nonlabored breathing Normal rate and rhythm Abdomen is nontender  Past pregnancies OB History  Gravida Para Term Preterm AB Living  2     1  SAB IAB Ectopic Multiple Live Births      1    # Outcome Date GA Lbr Len/2nd Weight Sex Type  Anes PTL Lv  2 Current           1 Gravida             I spent 20 minutes reviewing the patients chart, including labs and images as well as counseling the patient about her medical conditions. Greater than 50% of the time was spent in direct face-to-face patient counseling.  Penney Bowling  MFM, [place]   07/25/2023  10:14 AM

## 2023-08-04 ENCOUNTER — Encounter (HOSPITAL_COMMUNITY): Payer: Self-pay | Admitting: Obstetrics and Gynecology

## 2023-08-04 ENCOUNTER — Other Ambulatory Visit: Payer: Self-pay

## 2023-08-04 ENCOUNTER — Ambulatory Visit: Payer: Self-pay | Admitting: Certified Nurse Midwife

## 2023-08-04 ENCOUNTER — Inpatient Hospital Stay (HOSPITAL_COMMUNITY)
Admission: AD | Admit: 2023-08-04 | Discharge: 2023-08-04 | Disposition: A | Discharge status: Home / Self Care | Attending: Obstetrics and Gynecology | Admitting: Obstetrics and Gynecology

## 2023-08-04 DIAGNOSIS — R6889 Other general symptoms and signs: Secondary | ICD-10-CM

## 2023-08-04 DIAGNOSIS — O99512 Diseases of the respiratory system complicating pregnancy, second trimester: Secondary | ICD-10-CM | POA: Insufficient documentation

## 2023-08-04 DIAGNOSIS — Z3A25 25 weeks gestation of pregnancy: Secondary | ICD-10-CM

## 2023-08-04 DIAGNOSIS — O26892 Other specified pregnancy related conditions, second trimester: Secondary | ICD-10-CM

## 2023-08-04 DIAGNOSIS — J00 Acute nasopharyngitis [common cold]: Secondary | ICD-10-CM

## 2023-08-04 DIAGNOSIS — O98512 Other viral diseases complicating pregnancy, second trimester: Secondary | ICD-10-CM | POA: Insufficient documentation

## 2023-08-04 DIAGNOSIS — B349 Viral infection, unspecified: Secondary | ICD-10-CM | POA: Diagnosis not present

## 2023-08-04 LAB — RESPIRATORY PANEL BY PCR

## 2023-08-04 LAB — GROUP A STREP BY PCR: Group A Strep by PCR: NOT DETECTED

## 2023-08-04 MED ORDER — PSEUDOEPHEDRINE HCL 30 MG PO TABS
60.0000 mg | ORAL_TABLET | Freq: Once | ORAL | Status: AC
  Administered 2023-08-04: 60 mg via ORAL
  Filled 2023-08-04: qty 2

## 2023-08-04 MED ORDER — GUAIFENESIN ER 600 MG PO TB12
1200.0000 mg | ORAL_TABLET | Freq: Once | ORAL | Status: AC
  Administered 2023-08-04: 1200 mg via ORAL
  Filled 2023-08-04: qty 2

## 2023-08-04 NOTE — MAU Note (Signed)
 [name] is a 38 y.o. at [redacted]w[redacted]d here in MAU reporting: she's had cold symptoms since last Friday.  Reports she has a sore throat, productive cough, chest soreness, wheezing and HA.  Reports has been taking Mucinex and Robitussin, doesn't seem to be working.  States has taken Ibuprofen for the HA and gets relief.  Currently denies HA & sore throat.  Denies VB or LOF.  Endorses +FM.  LMP: 01/26/2023 Onset of complaint: Friday Pain score: 0 Vitals:   08/04/23 0854  BP: 105/64  Pulse: (!) 108  Resp: 19  Temp: 98.9 F (37.2 C)  SpO2: 100%     FHT: 137 bpm  Lab orders placed from triage: None

## 2023-08-04 NOTE — MAU Provider Note (Signed)
 History     CSN: [id number]  Arrival date and time: 08/04/23 9173   Event Date/Time   First Provider Initiated Contact with Patient 08/04/23 [phone]      Chief Complaint  Patient presents with   Congestion   Sore Throat   Cough   HPI  [name] is a 38 y.o. G2P0 at [redacted]w[redacted]d who presents for evaluation of cold like symptoms. Patient reports a week and a half ago she started having congestion, headaches, and sore throat that went away after a few days. However starting Wednesday the symptoms returned. She reports continued congestion, headaches, sore throat, itchy ears, occasionally coughing up gray phlegm, occasional shortness of breath with activity or laying down and chest heaviness. She's tried Mucinex, Robitussin, Ibuprofen, and Chloraseptic throat spray with minimal relief. Patient states she thinks it is just a cold but wanted to come in and make sure it wasn't something she needed antibiotics for like bronchitis or pneumonia.      She denies any vaginal bleeding, discharge, and leaking of fluid. Denies any constipation, diarrhea or any urinary complaints. Reports normal fetal movement.   OB History     Gravida  2   Para      Term      Preterm      AB      Living  1      SAB      IAB      Ectopic      Multiple      Live Births  1           Past Medical History:  Diagnosis Date   Crohn's colitis (HCC)     Past Surgical History:  Procedure Laterality Date   NO PAST SURGERIES      Family History  Problem Relation Age of Onset   Colon cancer Mother        age at onset 27   Hypertension Mother     Social History   Tobacco Use   Smoking status: Former    Types: Cigarettes   Smokeless tobacco: Never  Vaping Use   Vaping status: Never Used  Substance Use Topics   Alcohol use: No   Drug use: No    Allergies: No Known Allergies  Medications Prior to Admission  Medication Sig Dispense Refill Last Dose/Taking   aspirin EC 81 MG tablet  Take 81 mg by mouth daily. Swallow whole.   08/04/2023 Morning   MAGNESIUM PO Take by mouth.   Past Week   Multiple Vitamin (MULTIVITAMIN) capsule Take 1 capsule by mouth daily.   Past Week   Prenatal Vit-Fe Fumarate-FA (MULTIVITAMIN-PRENATAL) 27-0.8 MG TABS tablet Take 1 tablet by mouth daily at 12 noon.   Past Week   methocarbamol  (ROBAXIN ) 500 MG tablet Take 1 tablet (500 mg total) by mouth 2 (two) times daily. 20 tablet 0     Review of Systems  Constitutional:  Negative for appetite change, chills and fever.  HENT:  Positive for congestion, rhinorrhea, sinus pressure and sore throat.        Itchy ears  Respiratory:  Positive for cough. Negative for shortness of breath.        Chest feels heavy  Cardiovascular:  Negative for chest pain and palpitations.  Gastrointestinal:  Negative for abdominal pain, nausea and vomiting.  Genitourinary:  Negative for pelvic pain, vaginal bleeding and vaginal discharge.  Musculoskeletal:  Negative for myalgias.  Allergic/Immunologic: Negative for environmental allergies.  Neurological:  Positive for headaches. Negative for dizziness.   Physical Exam   Blood pressure (!) 142/78, pulse (!) 101, temperature 98.4 F (36.9 C), temperature source Oral, resp. rate 18, height 5' 8 (1.727 m), weight 124.3 kg, last menstrual period 01/26/2023, SpO2 98%.  Patient Vitals for the past 24 hrs:  BP Temp Temp src Pulse Resp SpO2 Height Weight  08/04/23 0925 -- -- -- -- -- 98 % -- --  08/04/23 0920 -- -- -- -- -- 98 % -- --  08/04/23 0915 (!) 142/78 98.4 F (36.9 C) Oral (!) 101 18 98 % -- --  08/04/23 0854 105/64 98.9 F (37.2 C) Oral (!) 108 19 100 % -- --  08/04/23 0846 -- -- -- -- -- -- 5' 8 (1.727 m) 124.3 kg    Physical Exam Constitutional:      General: She is not in acute distress.    Appearance: Normal appearance.  HENT:     Head: Normocephalic.     Nose: Congestion present.   Cardiovascular:     Rate and Rhythm: Normal rate and regular  rhythm.  Pulmonary:     Effort: Pulmonary effort is normal. No respiratory distress.     Breath sounds: Normal breath sounds. No wheezing.   Musculoskeletal:        General: Normal range of motion.     Cervical back: Normal range of motion.   Skin:    General: Skin is warm and dry.   Neurological:     Mental Status: She is alert and oriented to person, place, and time.   Psychiatric:        Mood and Affect: Mood normal.        Behavior: Behavior normal.     Fetal Tracing:  Baseline: 145bpm  Variability: moderate Accels: 10x10 Decels: none  Toco: none    MAU Course  Procedures  MDM Prenatal records from community office reviewed. Pregnancy complicated by AMA.  Mucinex Sudafed Resp Panel pending at discharge Patient is not actively having shortness of breath, fever, or tachycardia. Lung sounds clear. Low suspicion for PE or pneumonia   Orders Placed This Encounter  Procedures   Respiratory (~20 pathogens) panel by PCR    Standing Status:   Standing    Number of Occurrences:   1   Group A Strep by PCR    Standing Status:   Standing    Number of Occurrences:   1   Droplet precaution    Standing Status:   Standing    Number of Occurrences:   1   Meds ordered this encounter  Medications   pseudoephedrine (SUDAFED) tablet 60 mg   guaiFENesin (MUCINEX) 12 hr tablet 1,200 mg    Assessment and Plan   1. Cold virus   2. [redacted] weeks gestation of pregnancy   3. Flu-like symptoms     -Patient desires discharge and to be called with resp panel results rather than wait -Discharge home in stable condition -Discussed Flonase or allergy meds like Zyrtec or Claritin, saline nasal sprays or Neti pot, Tylenol , hot teas, cough drops, or Sudafed. Advised to avoid cold meds containing Phenylephrine  -Return precautions discussed -Discussed discontinuing Ibuprofen but can take Tylenol  -Patient advised to follow-up with Ob provider for next scheduled appointment -Patient  may return to MAU as needed or if her condition were to change or worsen   Elenor Mole, [facility] 08/04/2023, 10:16 AM

## 2023-09-04 ENCOUNTER — Other Ambulatory Visit: Payer: Self-pay | Admitting: *Deleted

## 2023-09-04 ENCOUNTER — Ambulatory Visit: Attending: Obstetrics and Gynecology | Admitting: Obstetrics and Gynecology

## 2023-09-04 ENCOUNTER — Ambulatory Visit (HOSPITAL_BASED_OUTPATIENT_CLINIC_OR_DEPARTMENT_OTHER)

## 2023-09-04 VITALS — BP 143/76

## 2023-09-04 DIAGNOSIS — O3663X0 Maternal care for excessive fetal growth, third trimester, not applicable or unspecified: Secondary | ICD-10-CM | POA: Diagnosis not present

## 2023-09-04 DIAGNOSIS — Z148 Genetic carrier of other disease: Secondary | ICD-10-CM

## 2023-09-04 DIAGNOSIS — K50919 Crohn's disease, unspecified, with unspecified complications: Secondary | ICD-10-CM

## 2023-09-04 DIAGNOSIS — O3660X0 Maternal care for excessive fetal growth, unspecified trimester, not applicable or unspecified: Secondary | ICD-10-CM | POA: Diagnosis not present

## 2023-09-04 DIAGNOSIS — O09523 Supervision of elderly multigravida, third trimester: Secondary | ICD-10-CM | POA: Diagnosis not present

## 2023-09-04 DIAGNOSIS — Z3A30 30 weeks gestation of pregnancy: Secondary | ICD-10-CM

## 2023-09-04 DIAGNOSIS — O99213 Obesity complicating pregnancy, third trimester: Secondary | ICD-10-CM | POA: Diagnosis not present

## 2023-09-04 DIAGNOSIS — Z362 Encounter for other antenatal screening follow-up: Secondary | ICD-10-CM | POA: Insufficient documentation

## 2023-09-04 DIAGNOSIS — O99210 Obesity complicating pregnancy, unspecified trimester: Secondary | ICD-10-CM | POA: Diagnosis present

## 2023-09-04 DIAGNOSIS — O09513 Supervision of elderly primigravida, third trimester: Secondary | ICD-10-CM

## 2023-09-04 DIAGNOSIS — O99613 Diseases of the digestive system complicating pregnancy, third trimester: Secondary | ICD-10-CM | POA: Diagnosis not present

## 2023-09-04 DIAGNOSIS — O285 Abnormal chromosomal and genetic finding on antenatal screening of mother: Secondary | ICD-10-CM | POA: Diagnosis present

## 2023-09-04 DIAGNOSIS — E669 Obesity, unspecified: Secondary | ICD-10-CM | POA: Diagnosis not present

## 2023-09-04 DIAGNOSIS — O09512 Supervision of elderly primigravida, second trimester: Secondary | ICD-10-CM

## 2023-09-04 DIAGNOSIS — O3662X0 Maternal care for excessive fetal growth, second trimester, not applicable or unspecified: Secondary | ICD-10-CM | POA: Diagnosis present

## 2023-09-04 NOTE — Progress Notes (Signed)
 Maternal-Fetal Medicine Consultation Name: [name] MRN: [id number]  G2 P1001 at 30w gestation.  Patient is here for fetal growth assessment. Maternal Crohn's disease.  Stable.  Patient had abnormal glucose challenge test (152 mg/dL). and will be undergoing 3-hour GTT.  Ultrasound Fetal growth is appropriate for gestational age.  Abdominal circumference measurement is at the 92nd percentile.  Amniotic fluid is normal good fetal activity seen.  Cephalic presentation  I counseled the patient that with a cutoff of 135 to 140 mg/dL on 1-hour glucose challenge test, approximately 15% to 20% of individuals will have gestational diabetes confirmed on 3-hour GTT. If patient has gestational diabetes and diet controlled, we recommend fetal growth assessments every 4 weeks.  If she requires insulin or metformin, weekly antenatal testing from [redacted] weeks gestation is recommended. Recommendations - An appointment was made for her to return in 4 weeks for fetal growth assessment.    Consultation including face-to-face (more than 50%) counseling 10 minutes.

## 2023-09-05 ENCOUNTER — Ambulatory Visit

## 2023-09-24 ENCOUNTER — Inpatient Hospital Stay (HOSPITAL_COMMUNITY)
Admission: AD | Admit: 2023-09-24 | Discharge: 2023-09-24 | Disposition: A | Discharge status: Home / Self Care | Attending: Obstetrics & Gynecology | Admitting: Obstetrics & Gynecology

## 2023-09-24 ENCOUNTER — Encounter (HOSPITAL_COMMUNITY): Payer: Self-pay | Admitting: Obstetrics and Gynecology

## 2023-09-24 DIAGNOSIS — Z3493 Encounter for supervision of normal pregnancy, unspecified, third trimester: Secondary | ICD-10-CM

## 2023-09-24 DIAGNOSIS — Z3689 Encounter for other specified antenatal screening: Secondary | ICD-10-CM | POA: Diagnosis not present

## 2023-09-24 DIAGNOSIS — O368130 Decreased fetal movements, third trimester, not applicable or unspecified: Secondary | ICD-10-CM | POA: Insufficient documentation

## 2023-09-24 DIAGNOSIS — Z3A32 32 weeks gestation of pregnancy: Secondary | ICD-10-CM

## 2023-09-24 NOTE — MAU Provider Note (Signed)
 History     CSN: [id number]  Arrival date and time: 09/24/23 1442   Event Date/Time   First Provider Initiated Contact with Patient 09/24/23 1539      Chief Complaint  Patient presents with   Decreased Fetal Movement   HPI [name] is a 38 y.o. year old G71P0 female at [redacted]w[redacted]d weeks gestation who presents to MAU reporting DFM since last night; like he's just not active. She reports there are movements feel lighter or softer than usual. She tried eating and drinking something and FM did not improve. She denies pain, VB, LOF or UCs. She receives [facility] with [facility]; next appt is 10/01/2023.   OB History     Gravida  2   Para      Term      Preterm      AB      Living  1      SAB      IAB      Ectopic      Multiple      Live Births  1           Past Medical History:  Diagnosis Date   Crohn's colitis (HCC)     Past Surgical History:  Procedure Laterality Date   NO PAST SURGERIES      Family History  Problem Relation Age of Onset   Colon cancer Mother        age at onset 8   Hypertension Mother     Social History   Tobacco Use   Smoking status: Former    Types: Cigarettes   Smokeless tobacco: Never  Vaping Use   Vaping status: Never Used  Substance Use Topics   Alcohol use: No   Drug use: No    Allergies: No Known Allergies  Medications Prior to Admission  Medication Sig Dispense Refill Last Dose/Taking   aspirin EC 81 MG tablet Take 81 mg by mouth daily. Swallow whole.   09/23/2023   MAGNESIUM PO Take by mouth.   09/24/2023   Multiple Vitamin (MULTIVITAMIN) capsule Take 1 capsule by mouth daily.   09/24/2023   Prenatal Vit-Fe Fumarate-FA (MULTIVITAMIN-PRENATAL) 27-0.8 MG TABS tablet Take 1 tablet by mouth daily at 12 noon.   09/24/2023    Review of Systems  Constitutional: Negative.   HENT: Negative.    Eyes: Negative.   Respiratory: Negative.    Cardiovascular: Negative.   Gastrointestinal: Negative.    Endocrine: Negative.   Genitourinary:        DFM, lighter & softer  Musculoskeletal: Negative.   Skin: Negative.   Allergic/Immunologic: Negative.   Neurological: Negative.   Hematological: Negative.   Psychiatric/Behavioral: Negative.     Physical Exam   Last menstrual period 01/26/2023.  Physical Exam Vitals and nursing note reviewed.  Constitutional:      Appearance: Normal appearance. She is obese.  Cardiovascular:     Rate and Rhythm: Normal rate.  Pulmonary:     Effort: Pulmonary effort is normal.  Abdominal:     Palpations: Abdomen is soft.  Genitourinary:    Comments: Not indicated Musculoskeletal:        General: Normal range of motion.  Skin:    General: Skin is warm and dry.  Neurological:     Mental Status: She is alert and oriented to person, place, and time.  Psychiatric:        Mood and Affect: Mood normal.  Behavior: Behavior normal.        Thought Content: Thought content normal.        Judgment: Judgment normal.   REACTIVE NST - FHR: 145 bpm / moderate variability / accels present / decels absent / TOCO: none  MAU Course  Procedures  MDM EFM  Assessment and Plan  1. NST (non-stress test) reactive (Primary) - Category 1 FHR tracing  2. Fetal movement present during pregnancy in third trimester - Information provided on [facility]   3. [redacted] weeks gestation of pregnancy   - Discharge home - Keep scheduled appt [facility] on 10/01/2023 - Patient verbalized an understanding of the plan of care and agrees.   [name], CNM 09/24/2023, 3:39 PM

## 2023-09-24 NOTE — MAU Note (Signed)
 [name] is a 38 y.o. at [redacted]w[redacted]d here in MAU reporting: decreased fetal movement since \\last  pm. Is feeling movement but the baby is just not as active. Tried eating and drinking and could not get the baby to move. Denies bleeding, ROM or contactions.   Onset of complaint: last pm Pain score: 0/10 There were no vitals filed for this visit.   FHT: 156  Lab orders placed from triage: none

## 2023-10-01 ENCOUNTER — Ambulatory Visit: Attending: Obstetrics and Gynecology | Admitting: Obstetrics and Gynecology

## 2023-10-01 ENCOUNTER — Other Ambulatory Visit: Payer: Self-pay | Admitting: *Deleted

## 2023-10-01 ENCOUNTER — Ambulatory Visit (HOSPITAL_BASED_OUTPATIENT_CLINIC_OR_DEPARTMENT_OTHER)

## 2023-10-01 DIAGNOSIS — O09523 Supervision of elderly multigravida, third trimester: Secondary | ICD-10-CM | POA: Insufficient documentation

## 2023-10-01 DIAGNOSIS — O3660X0 Maternal care for excessive fetal growth, unspecified trimester, not applicable or unspecified: Secondary | ICD-10-CM

## 2023-10-01 DIAGNOSIS — O09513 Supervision of elderly primigravida, third trimester: Secondary | ICD-10-CM | POA: Diagnosis not present

## 2023-10-01 DIAGNOSIS — O99210 Obesity complicating pregnancy, unspecified trimester: Secondary | ICD-10-CM

## 2023-10-01 DIAGNOSIS — Z148 Genetic carrier of other disease: Secondary | ICD-10-CM | POA: Insufficient documentation

## 2023-10-01 DIAGNOSIS — E669 Obesity, unspecified: Secondary | ICD-10-CM | POA: Diagnosis not present

## 2023-10-01 DIAGNOSIS — Z3A33 33 weeks gestation of pregnancy: Secondary | ICD-10-CM | POA: Diagnosis not present

## 2023-10-01 DIAGNOSIS — O99613 Diseases of the digestive system complicating pregnancy, third trimester: Secondary | ICD-10-CM

## 2023-10-01 DIAGNOSIS — O285 Abnormal chromosomal and genetic finding on antenatal screening of mother: Secondary | ICD-10-CM

## 2023-10-01 DIAGNOSIS — K5090 Crohn's disease, unspecified, without complications: Secondary | ICD-10-CM

## 2023-10-01 DIAGNOSIS — O3663X0 Maternal care for excessive fetal growth, third trimester, not applicable or unspecified: Secondary | ICD-10-CM

## 2023-10-01 DIAGNOSIS — Z362 Encounter for other antenatal screening follow-up: Secondary | ICD-10-CM | POA: Insufficient documentation

## 2023-10-01 DIAGNOSIS — K50919 Crohn's disease, unspecified, with unspecified complications: Secondary | ICD-10-CM

## 2023-10-01 DIAGNOSIS — O99213 Obesity complicating pregnancy, third trimester: Secondary | ICD-10-CM

## 2023-10-01 NOTE — Progress Notes (Signed)
 After review, MFM consult with provider is not indicated for today  Arna Ranks, MD 10/01/2023 3:53 PM  [facility]

## 2023-10-02 ENCOUNTER — Ambulatory Visit

## 2023-10-12 ENCOUNTER — Other Ambulatory Visit: Payer: Self-pay

## 2023-10-12 ENCOUNTER — Inpatient Hospital Stay (HOSPITAL_BASED_OUTPATIENT_CLINIC_OR_DEPARTMENT_OTHER)

## 2023-10-12 ENCOUNTER — Encounter (HOSPITAL_COMMUNITY): Payer: Self-pay | Admitting: Obstetrics and Gynecology

## 2023-10-12 ENCOUNTER — Inpatient Hospital Stay (HOSPITAL_COMMUNITY)
Admission: AD | Admit: 2023-10-12 | Discharge: 2023-10-16 | DRG: 807 | Disposition: A | Discharge status: Home / Self Care | Attending: Obstetrics and Gynecology | Admitting: Obstetrics and Gynecology

## 2023-10-12 DIAGNOSIS — O99824 Streptococcus B carrier state complicating childbirth: Secondary | ICD-10-CM | POA: Diagnosis present

## 2023-10-12 DIAGNOSIS — O4593 Premature separation of placenta, unspecified, third trimester: Secondary | ICD-10-CM | POA: Diagnosis present

## 2023-10-12 DIAGNOSIS — Z3A35 35 weeks gestation of pregnancy: Secondary | ICD-10-CM

## 2023-10-12 DIAGNOSIS — O09523 Supervision of elderly multigravida, third trimester: Secondary | ICD-10-CM

## 2023-10-12 DIAGNOSIS — Z7982 Long term (current) use of aspirin: Secondary | ICD-10-CM

## 2023-10-12 DIAGNOSIS — O1414 Severe pre-eclampsia complicating childbirth: Secondary | ICD-10-CM | POA: Diagnosis present

## 2023-10-12 DIAGNOSIS — O3663X0 Maternal care for excessive fetal growth, third trimester, not applicable or unspecified: Secondary | ICD-10-CM

## 2023-10-12 DIAGNOSIS — O1410 Severe pre-eclampsia, unspecified trimester: Secondary | ICD-10-CM | POA: Diagnosis present

## 2023-10-12 DIAGNOSIS — O99213 Obesity complicating pregnancy, third trimester: Secondary | ICD-10-CM | POA: Diagnosis not present

## 2023-10-12 DIAGNOSIS — O4693 Antepartum hemorrhage, unspecified, third trimester: Secondary | ICD-10-CM

## 2023-10-12 DIAGNOSIS — Z148 Genetic carrier of other disease: Secondary | ICD-10-CM | POA: Diagnosis not present

## 2023-10-12 DIAGNOSIS — E669 Obesity, unspecified: Secondary | ICD-10-CM

## 2023-10-12 DIAGNOSIS — Z87891 Personal history of nicotine dependence: Secondary | ICD-10-CM

## 2023-10-12 DIAGNOSIS — K5090 Crohn's disease, unspecified, without complications: Secondary | ICD-10-CM | POA: Diagnosis present

## 2023-10-12 DIAGNOSIS — Z8249 Family history of ischemic heart disease and other diseases of the circulatory system: Secondary | ICD-10-CM

## 2023-10-12 DIAGNOSIS — O09519 Supervision of elderly primigravida, unspecified trimester: Secondary | ICD-10-CM

## 2023-10-12 LAB — COMPREHENSIVE METABOLIC PANEL WITH GFR
ALT: 15 U/L (ref 0–44)
AST: 22 U/L (ref 15–41)
Albumin: 2.8 g/dL — ABNORMAL LOW (ref 3.5–5.0)
Alkaline Phosphatase: 102 U/L (ref 38–126)
Anion gap: 13 (ref 5–15)
BUN: 8 mg/dL (ref 6–20)
CO2: 16 mmol/L — ABNORMAL LOW (ref 22–32)
Calcium: 9.6 mg/dL (ref 8.9–10.3)
Chloride: 104 mmol/L (ref 98–111)
Creatinine, Ser: 0.71 mg/dL (ref 0.44–1.00)
GFR, Estimated: >60 mL/min (ref >60)
Glucose, Bld: 113 mg/dL — ABNORMAL HIGH (ref 70–99)
Potassium: 3.6 mmol/L (ref 3.5–5.1)
Sodium: 133 mmol/L — ABNORMAL LOW (ref 135–145)
Total Bilirubin: 0.5 mg/dL (ref 0.0–1.2)
Total Protein: 6.6 g/dL (ref 6.5–8.1)

## 2023-10-12 LAB — PROTEIN / CREATININE RATIO, URINE
Creatinine, Urine: 67 mg/dL
Protein Creatinine Ratio: 0.79 mg/mg{creat} — ABNORMAL HIGH (ref 0.00–0.15)
Total Protein, Urine: 53 mg/dL

## 2023-10-12 LAB — CBC
HCT: 42.4 % (ref 36.0–46.0)
Hemoglobin: 13.9 g/dL (ref 12.0–15.0)
MCH: 31 pg (ref 26.0–34.0)
MCHC: 32.8 g/dL (ref 30.0–36.0)
MCV: 94.6 fL (ref 80.0–100.0)
Platelets: 240 K/uL (ref 150–400)
RBC: 4.48 MIL/uL (ref 3.87–5.11)
RDW: 13.5 % (ref 11.5–15.5)
WBC: 9.7 K/uL (ref 4.0–10.5)
nRBC: 0 % (ref 0.0–0.2)

## 2023-10-12 LAB — LACTATE DEHYDROGENASE: LDH: 157 U/L (ref 98–192)

## 2023-10-12 LAB — GROUP B STREP BY PCR: Group B strep by PCR: NEGATIVE

## 2023-10-12 LAB — URIC ACID: Uric Acid, Serum: 5.2 mg/dL (ref 2.5–7.1)

## 2023-10-12 LAB — PREPARE RBC (CROSSMATCH)

## 2023-10-12 MED ORDER — SOD CITRATE-CITRIC ACID 500-334 MG/5ML PO SOLN
30.0000 mL | ORAL | Status: DC | PRN
  Administered 2023-10-13: 30 mL via ORAL
  Filled 2023-10-12: qty 30

## 2023-10-12 MED ORDER — MAGNESIUM SULFATE 40 GM/1000ML IV SOLN
2.0000 g/h | INTRAVENOUS | Status: AC
  Administered 2023-10-13 – 2023-10-14 (×2): 2 g/h via INTRAVENOUS
  Filled 2023-10-12 (×3): qty 1000

## 2023-10-12 MED ORDER — BETAMETHASONE SOD PHOS & ACET 6 (3-3) MG/ML IJ SUSP
12.0000 mg | Freq: Two times a day (BID) | INTRAMUSCULAR | Status: AC
  Administered 2023-10-12 – 2023-10-13 (×2): 12 mg via INTRAMUSCULAR
  Filled 2023-10-12: qty 5

## 2023-10-12 MED ORDER — HYDRALAZINE HCL 20 MG/ML IJ SOLN: 10.0000 mg | INTRAMUSCULAR | Status: DC | PRN

## 2023-10-12 MED ORDER — OXYTOCIN-SODIUM CHLORIDE 30-0.9 UT/500ML-% IV SOLN
1.0000 m[IU]/min | INTRAVENOUS | Status: DC
  Administered 2023-10-12: 1 m[IU]/min via INTRAVENOUS

## 2023-10-12 MED ORDER — OXYCODONE-ACETAMINOPHEN 5-325 MG PO TABS: 2.0000 | ORAL_TABLET | ORAL | Status: DC | PRN

## 2023-10-12 MED ORDER — ACETAMINOPHEN 325 MG PO TABS
650.0000 mg | ORAL_TABLET | ORAL | Status: DC | PRN
  Administered 2023-10-12: 650 mg via ORAL
  Filled 2023-10-12: qty 2

## 2023-10-12 MED ORDER — ONDANSETRON HCL 4 MG/2ML IJ SOLN
4.0000 mg | Freq: Four times a day (QID) | INTRAMUSCULAR | Status: DC | PRN
  Administered 2023-10-12 – 2023-10-13 (×2): 4 mg via INTRAVENOUS
  Filled 2023-10-12 (×2): qty 2

## 2023-10-12 MED ORDER — LABETALOL HCL 5 MG/ML IV SOLN: 80.0000 mg | INTRAVENOUS | Status: DC | PRN

## 2023-10-12 MED ORDER — LABETALOL HCL 5 MG/ML IV SOLN
20.0000 mg | INTRAVENOUS | Status: DC | PRN
  Administered 2023-10-12: 20 mg via INTRAVENOUS
  Filled 2023-10-12: qty 4

## 2023-10-12 MED ORDER — SODIUM CHLORIDE 0.9% IV SOLUTION
Freq: Once | INTRAVENOUS | Status: DC
Start: 2023-10-12 — End: 2023-10-13

## 2023-10-12 MED ORDER — LACTATED RINGERS IV SOLN: 500.0000 mL | INTRAVENOUS | Status: DC | PRN

## 2023-10-12 MED ORDER — TERBUTALINE SULFATE 1 MG/ML IJ SOLN: 0.2500 mg | Freq: Once | INTRAMUSCULAR | Status: DC | PRN

## 2023-10-12 MED ORDER — OXYTOCIN BOLUS FROM INFUSION
333.0000 mL | Freq: Once | INTRAVENOUS | Status: AC
Start: 2023-10-12 — End: 2023-10-13
  Administered 2023-10-13: 333 mL via INTRAVENOUS

## 2023-10-12 MED ORDER — FENTANYL CITRATE (PF) 100 MCG/2ML IJ SOLN
50.0000 ug | INTRAMUSCULAR | Status: DC | PRN
  Administered 2023-10-12: 50 ug via INTRAVENOUS
  Administered 2023-10-13 (×5): 100 ug via INTRAVENOUS
  Filled 2023-10-12 (×7): qty 2

## 2023-10-12 MED ORDER — LACTATED RINGERS IV SOLN: INTRAVENOUS | Status: DC

## 2023-10-12 MED ORDER — SODIUM CHLORIDE 0.9 % IV SOLN
5.0000 10*6.[IU] | Freq: Once | INTRAVENOUS | Status: AC
  Administered 2023-10-12: 5 10*6.[IU] via INTRAVENOUS
  Filled 2023-10-12: qty 5

## 2023-10-12 MED ORDER — LIDOCAINE HCL (PF) 1 % IJ SOLN: 30.0000 mL | INTRAMUSCULAR | Status: DC | PRN

## 2023-10-12 MED ORDER — OXYTOCIN-SODIUM CHLORIDE 30-0.9 UT/500ML-% IV SOLN
2.5000 [IU]/h | INTRAVENOUS | Status: DC
  Filled 2023-10-12: qty 500

## 2023-10-12 MED ORDER — OXYCODONE-ACETAMINOPHEN 5-325 MG PO TABS: 1.0000 | ORAL_TABLET | ORAL | Status: DC | PRN

## 2023-10-12 MED ORDER — BETAMETHASONE SOD PHOS & ACET 6 (3-3) MG/ML IJ SUSP: 12.0000 mg | INTRAMUSCULAR | Status: DC

## 2023-10-12 MED ORDER — MAGNESIUM SULFATE BOLUS VIA INFUSION
4.0000 g | Freq: Once | INTRAVENOUS | Status: AC
  Administered 2023-10-12: 4 g via INTRAVENOUS
  Filled 2023-10-12: qty 1000

## 2023-10-12 MED ORDER — PENICILLIN G POT IN DEXTROSE 60000 UNIT/ML IV SOLN
3.0000 10*6.[IU] | INTRAVENOUS | Status: DC
  Administered 2023-10-13 (×2): 3 10*6.[IU] via INTRAVENOUS
  Filled 2023-10-12 (×3): qty 50

## 2023-10-12 MED ORDER — LABETALOL HCL 5 MG/ML IV SOLN
40.0000 mg | INTRAVENOUS | Status: DC | PRN
  Filled 2023-10-12: qty 8

## 2023-10-12 NOTE — MAU Provider Note (Signed)
 History     CSN: [id number]  Arrival date and time: 10/12/23 1111   None     Chief Complaint  Patient presents with   Vaginal Bleeding   HPI  [name] is a 38 y.o. female G2P1001 @ [redacted]w[redacted]d here in MAU with complaints of vaginal bleeding. She reports an abrupt onset of bright red vaginal bleeding at home today. No history of vaginal bleeding or elevated BP. She has no pain.  No history of cHTN or gHTN, she is taking BASA daily.   OB History     Gravida  2   Para  1   Term  1   Preterm      AB      Living  1      SAB      IAB      Ectopic      Multiple      Live Births  1           Past Medical History:  Diagnosis Date   Crohn's colitis (HCC)     Past Surgical History:  Procedure Laterality Date   NO PAST SURGERIES      Family History  Problem Relation Age of Onset   Colon cancer Mother        age at onset 9   Hypertension Mother     Social History   Tobacco Use   Smoking status: Former    Types: Cigarettes   Smokeless tobacco: Never  Vaping Use   Vaping status: Never Used  Substance Use Topics   Alcohol use: No   Drug use: No    Allergies: No Known Allergies  Medications Prior to Admission  Medication Sig Dispense Refill Last Dose/Taking   aspirin EC 81 MG tablet Take 81 mg by mouth daily. Swallow whole.   10/11/2023 Morning   docusate sodium  (COLACE) 50 MG capsule Take 50 mg by mouth 2 (two) times daily.   10/11/2023 Noon   MAGNESIUM  PO Take by mouth.   10/11/2023 Noon   Prenatal Vit-Fe Fumarate-FA (MULTIVITAMIN-PRENATAL) 27-0.8 MG TABS tablet Take 1 tablet by mouth daily at 12 noon.   10/11/2023 Noon   Multiple Vitamin (MULTIVITAMIN) capsule Take 1 capsule by mouth daily.      No results found for this or any previous visit (from the past 48 hours).   Review of Systems  Constitutional:  Negative for fever.  Gastrointestinal:  Negative for abdominal pain.  Genitourinary:  Positive for vaginal bleeding.   Physical  Exam   Blood pressure (!) 160/89, pulse (!) 119, temperature 98.4 F (36.9 C), temperature source Oral, resp. rate (!) 28, height 5' 8 (1.727 m), weight 113.9 kg, last menstrual period 01/26/2023, SpO2 99%.  Patient Vitals for the past 24 hrs:  BP Temp Temp src Pulse Resp SpO2 Height Weight  10/12/23 1200 (!) 140/90 98.5 F (36.9 C) Oral (!) 110 20 -- -- --  10/12/23 1134 -- -- -- -- -- -- 5' 8 (1.727 m) 113.9 kg  10/12/23 1130 (!) 160/89 -- -- (!) 119 -- 99 % -- --  10/12/23 1120 (!) 181/104 98.4 F (36.9 C) Oral (!) 140 (!) 28 99 % -- --     Physical Exam Constitutional:      General: She is not in acute distress.    Appearance: Normal appearance. She is diaphoretic. She is not ill-appearing or toxic-appearing.  HENT:     Head: Normocephalic.  Genitourinary:    Comments: Vagina -  Moderate amount of dark red blood in the vagina, small clots noted.  Bimanual exam: deferred, cervix appears closed.  Chaperone present for exam.   Skin:    General: Skin is warm.  Neurological:     Mental Status: She is alert and oriented to person, place, and time.   Fetal Tracing: Baseline: 145 bpm Variability: minimal/moderate  Accelerations: 15x15/10x10 Decelerations: None Toco: None  MAU Course  Procedures None   MDM  Discussed patient with Dr. Henry. Concerned for abruption based on new onset severe range BP's and vaginal bleeding. Magnesium  orders along with labetalol  IV. Discussed admission and Dr. Henry agreed to admit to labor, avoid delay in getting patient upstairs so that magnesium  can be started.     Assessment and Plan   A:  1. Vaginal bleeding in pregnancy, third trimester   2. [redacted] weeks gestation of pregnancy     P:  Admit to labor.  RN to speak to [facility] the admitting APP. Magnesium  ordered 2 IV's placed in MAU  Raijon Lindfors, Delon FERNS, NP 10/12/2023 12:10 PM

## 2023-10-12 NOTE — Progress Notes (Signed)
 Vaginal Sp[eculum delivery

## 2023-10-12 NOTE — Progress Notes (Signed)
 Magnesium bolus started.

## 2023-10-12 NOTE — Progress Notes (Signed)
 Labor Progress Note  [name] is a 38 y.o. female, G2P1001, IUP at 35.4 weeks, presenting for IOL for was seen in MAU with vaginal bleeding, and noted to have new onset Preeclampsia with severe features. Suspected partial placenta abruption, US  appeared unremarkable and vxt presentation. PCR Pending, lab unremarkable. GBS-. H/O Chrones in remission. LGA during this pregnancy. AMA   Subjective: Pt stable in bed and resting well, feeling slight cxt but not too many. Foley in place and tolerated procedure well. Pt on magnesium  and tolerating well with family supportive and at bedside. + HA.  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 Total I/O In: 960.8 [P.O.:270; I.V.:690.8] Out: 775 [Urine:775] NST: FHR baseline 150 bpm, Variability: moderate, Accelerations:present, Decelerations:  Absent= Cat 1/Reactive CTX:  irregular, every 2-6 minutes Uterus gravid, soft non tender, moderate to palpate with contractions.  SVE:  Dilation: 2 Effacement (%): 70 Station: -2 Exam by:: J Farhad Burleson  CNM Pitocin  at 94mUn/min  Assessment:  [name] is a 38 y.o. female, G2P1001, IUP at 35.4 weeks, presenting for IOL for was seen in MAU with vaginal bleeding, and noted to have new onset Preeclampsia with severe features. Suspected partial placenta abruption, US  appeared  unremarkable and vxt presentation. PCR Pending, lab unremarkable. GBS-. H/O Chrones in remission. LGA during this pregnancy. AMA .  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 PCR 0.79, cmp, cbc uric acid and LDH unremarkable.    Plan: Continue labor plan Continuous/intermittent monitoring Rest Ambulate Frequent position changes to facilitate fetal rotation and descent. Will reassess with cervical exam at 4 hours or earlier if necessary Continue pitocin  per protocol 1x1 Anticipate labor progression and vaginal delivery.   Md Henry aware of plan and verbalized agreement.   Hamid Brookens  CNM, FNP-C, PMHNP-BC  [id number]  [place], [place] [address]  Cell: [phone]  Office Phone: [phone] Fax: [phone] 10/12/2023  4:46 PM

## 2023-10-12 NOTE — Progress Notes (Signed)
 Patient informed that the ultrasound is considered a limited OB ultrasound and is not intended to be a complete ultrasound exam.  Patient also informed that the ultrasound is not being completed with the intent of assessing for fetal or placental anomalies or any pelvic abnormalities.  Explained that the purpose of today's ultrasound is to assess for  presentation. Patient acknowledges the purpose of the exam and the limitations of the study. Patient vertex by vertex.

## 2023-10-12 NOTE — H&P (Signed)
 [name] is a 38 y.o. female, G2P1001, IUP at 35.4 weeks, presenting for IOL for was seen in MAU with vaginal bleeding, and noted to have new onset Preeclampsia with severe features. Suspected partial placenta abruption, US  appeared unremarkable and vxt presentation. PCR Pending, lab unremarkable. GBS-. H/O Chrones in remission. LGA during this pregnancy. AMA. Pt endorse + Fm. Denies vaginal leakage. Denies feeling cxt's.   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   AMA (advanced maternal age) primigravida 35+ 06/13/2023   Obesity affecting pregnancy, antepartum 06/13/2023   Crohn's disease (HCC) 06/09/2023   Bilateral leg pain 06/09/2023     Active Ambulatory Problems    Diagnosis Date Noted   Crohn's disease (HCC) 06/09/2023   Bilateral leg pain 06/09/2023   AMA (advanced maternal age) primigravida 35+ 06/13/2023   Obesity affecting pregnancy, antepartum 06/13/2023   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 Swallow whole.   10/11/2023 Morning   docusate sodium  (COLACE) 50 MG capsule Take 50 mg by mouth 2 (two) times daily.   10/11/2023 Noon   MAGNESIUM  PO Take by mouth.   10/11/2023 Noon   Prenatal Vit-Fe Fumarate-FA (MULTIVITAMIN-PRENATAL) 27-0.8 MG TABS tablet Take 1 tablet by mouth daily at 12 noon.   10/11/2023 Noon   Multiple Vitamin  (MULTIVITAMIN) capsule Take 1 capsule by mouth daily.       Past Medical History:  Diagnosis Date   Crohn's colitis (HCC)      No current facility-administered medications on file prior to encounter.   Current Outpatient Medications on File Prior to Encounter  Medication Sig Dispense Refill   aspirin EC 81 MG tablet Take 81 mg by mouth daily. Swallow whole.     docusate sodium  (COLACE) 50 MG capsule Take 50 mg by mouth 2 (two) times daily.     MAGNESIUM  PO Take by mouth.     Prenatal Vit-Fe Fumarate-FA (MULTIVITAMIN-PRENATAL) 27-0.8 MG TABS tablet Take 1 tablet by mouth daily at 12 noon.     Multiple Vitamin (MULTIVITAMIN) capsule Take 1 capsule by mouth daily.       No Known Allergies  History of present pregnancy: Pt Info/Preference:  Screening/Consents:  Labs:   EDD: Estimated Date of Delivery: 11/13/23  Establised: Patient's last menstrual period was 01/26/2023 (within weeks).  Anatomy Scan: Date: 19.4 weeks Placenta Location: posterior Genetic Screen: Panoroma:LR AFP:  First Tri: Quad: Horizon: fragile X trait  Office: [facility]            First PNV: 10.4 weeks Blood Type --/--/A POS (08/31 1125)  Language: english Last PNV: 34 weeks Rhogam    Flu Vaccine:  declined   Antibody NEG (08/31 1125)  TDaP vaccine UTD   GTT: Early: 5.0 Third Trimester: 96  Feeding Plan: breast BTL: Desire interval tubal  Rubella:  Unknown Labs placed.   Contraception: Interval tubal desired  VBAC: no RPR: Nonreactive (02/26 0000)   Circumcision: ???   HBsAg: Negative (02/26 0000)  Pediatrician:  ???   HIV: Non-reactive (02/26 0000)   Prenatal Classes: yes Additional US : Yes, 34 weeks vertex, afi 13.24, 6.1lbs 90% AC 99% GBS: PRESUMPTIVE NEGATIVE/-- (08/31 1204)(For PCN allergy, check sensitivities)       Chlamydia: neg    MFM Referral/Consult:  GC: neg  Support Person: Her mother   PAP: 2023  Pain Management: epidural Neonatologist Referral:  Hgb Electrophoresis:  AA  Birth Plan: DCC   Hgb NOB:  13.2    28W: 12.1   OB History     Gravida  2   Para  1   Term  1   Preterm      AB      Living  1      SAB      IAB      Ectopic      Multiple      Live Births  1          Past Medical History:  Diagnosis Date   Crohn's colitis (HCC)    Past Surgical History:  Procedure Laterality Date   NO PAST SURGERIES     Family History: family history includes Colon cancer in her mother; Hypertension in her mother. Social History:  reports that she has quit smoking. Her smoking use included cigarettes. She has never used smokeless tobacco. She reports that she does not drink alcohol and does not use drugs.   Prenatal Transfer Tool  Maternal Diabetes: No Genetic Screening: Normal Maternal Ultrasounds/Referrals: Normal Fetal Ultrasounds or other Referrals:  None Maternal Substance Abuse:  No Significant Maternal Medications:  None Significant Maternal Lab Results: Group B Strep negative  ROS:  Review of Systems  Constitutional: Negative.      Physical Exam: BP (!) 126/100   Pulse 96   Temp 98.5 F (36.9 C) (Oral)   Resp 16   Ht 5' 8 (1.727 m)   Wt 113.9 kg   LMP 01/26/2023 (Within Weeks)   SpO2 100%   BMI 38.16 kg/m   Physical Exam Vitals and nursing note reviewed.  Constitutional:      Appearance: Normal appearance.  HENT:     Head: Normocephalic.     Nose: Nose normal.     Mouth/Throat:     Pharynx: Oropharynx is clear.  Eyes:     Conjunctiva/sclera: Conjunctivae normal.  Cardiovascular:     Rate and Rhythm: Normal rate and regular rhythm.     Pulses: Normal pulses.     Heart sounds: Normal heart sounds.  Pulmonary:     Effort: Pulmonary effort is normal.     Breath sounds: Normal breath sounds.  Abdominal:     General: Bowel sounds are normal.  Genitourinary:    Comments: Uterus gravida and pelvis adequate.  Musculoskeletal:        General: Normal range of motion.     Cervical back: Normal range of motion and neck supple.  Skin:     General: Skin is warm.     Capillary Refill: Capillary refill takes less than 2 seconds.  Neurological:     General: No focal deficit present.     Mental Status: She is alert.  Psychiatric:        Mood and Affect: Mood normal.      NST: FHR baseline 145 bpm, Variability: moderate, Accelerations:present, Decelerations:  Absent= Cat 1/Reactive UC:   none SVE:  Closed  ,  vertex verified by US .  Leopold's: Position vertex, EFW 6lbs via leopold's.   Labs: Results for orders placed or performed during the hospital encounter of 10/12/23 (from the past 24 hours)  Comprehensive metabolic panel     Status: Abnormal   Collection Time: 10/12/23 11:23 AM  Result Value Ref Range   Sodium 133 (L) 135 - 145 mmol/L   Potassium 3.6 3.5 - 5.1 mmol/L   Chloride 104 98 - 111 mmol/L   CO2 16 (L) 22 - 32 mmol/L   Glucose, Bld 113 (H) 70 - 99 mg/dL   BUN 8 6 - 20 mg/dL   Creatinine, Ser 9.28 0.44 - 1.00 mg/dL   Calcium 9.6 8.9 - 89.6 mg/dL   Total Protein 6.6 6.5 - 8.1 g/dL   Albumin 2.8 (L) 3.5 - 5.0 g/dL   AST 22 15 - 41 U/L   ALT 15 0 - 44 U/L   Alkaline Phosphatase 102 38 - 126 U/L   Total Bilirubin 0.5 0.0 - 1.2 mg/dL   GFR, Estimated >39 >39 mL/min   Anion gap 13 5 - 15  Type and screen [facility]     Status: None (Preliminary result)   Collection Time: 10/12/23 11:25 AM  Result Value Ref Range   ABO/RH(D) A POS    Antibody Screen NEG    Sample Expiration 10/15/2023,2359    Unit Number [id number]    Blood Component Type RED CELLS,LR    Unit division 00    Status of Unit ALLOCATED    Transfusion Status OK TO TRANSFUSE    Crossmatch Result      Compatible Performed at [facility], [address] [place], [place] [address]    Unit Number [id number]    Blood Component Type RED CELLS,LR    Unit division 00    Status of Unit ALLOCATED    Transfusion Status OK TO TRANSFUSE    Crossmatch Result Compatible   Group B strep by PCR     Status: None    Collection Time: 10/12/23 12:04 PM   Specimen: Vaginal/Rectal; Genital  Result Value Ref Range   Group B strep by PCR PRESUMPTIVE NEGATIVE PRESUMPTIVE NEGATIVE  Prepare RBC (crossmatch)     Status: None   Collection Time: 10/12/23 12:30 PM  Result Value Ref Range   Order Confirmation      ORDER PROCESSED BY BLOOD BANK Performed at [facility], [address] [place], [place] [address]     Imaging:  US  MFM OB FOLLOW UP Result Date: 10/01/2023 ----------------------------------------------------------------------  OBSTETRICS REPORT                       (Signed Final 10/01/2023 03:52 pm) ---------------------------------------------------------------------- Patient Info  ID #:       [id number]                          D.O.B.:  Nov 16, 1985 (38 yrs)(F)  Name:       [name]                 Visit Date: 10/01/2023 12:24 pm ---------------------------------------------------------------------- Performed By  Attending:        Fredia Fresh MD        Ref. Address:     [facility]  Obstetrics &                                                             Gynecology                                                             [address]                                                             [address]                                                             [place], [place]                                                             [address]  Performed By:     Jonette Nap        Location:         [facility]                                  [facility]  Referred By:      VICKI LATHAM  CNM ---------------------------------------------------------------------- Orders  #   Description                           Code        Ordered By  1  US  MFM OB FOLLOW UP                   [id number]    RAVI SHANKAR ----------------------------------------------------------------------  #  Order #                     Accession #                Episode #  1  [id number]                   [id number]                 [id number] ---------------------------------------------------------------------- Indications  Large for gestational age fetus affecting      O36.60X0  management of mother  Advanced maternal age multigravida 58+,        O46.523  third trimester (35)  Obesity complicating pregnancy, third          O99.213  trimester (BMI 35)  Maternal Crohn's disease affecting             O99.613  pregnancy in third trimester  Genetic carrier (Fragile X intermediate allele)Z14.8  Encounter for other antenatal screening        Z36.2  follow-up  LR NIPS - Female, 3hr GTT WNL  [redacted] weeks gestation of pregnancy                Z3A.33 ---------------------------------------------------------------------- Fetal Evaluation  Num Of Fetuses:         1  Fetal Heart Rate(bpm):  143  Cardiac Activity:       Observed  Presentation:           Cephalic  Placenta:               Posterior  P. Cord Insertion:      Previously seen  Amniotic Fluid  AFI FV:      Within normal limits  AFI Sum(cm)     %Tile       Largest Pocket(cm)  13.24           43          4.2  RUQ(cm)       RLQ(cm)       LUQ(cm)        LLQ(cm)  4.2           2.93          2.34           3.77 ---------------------------------------------------------------------- Biometry  BPD:      87.1  mm     G. Age:  35w 1d         82  %    CI:        78.61   %    70 - 86                                                          FL/HC:      21.2   %  19.4 - 21.8  HC:      310.7  mm     G. Age:  34w 5d         35  %    HC/AC:      0.95        0.96 - 1.11  AC:      328.7  mm     G. Age:  36w 5d         99  %    FL/BPD:     75.7   %    71 - 87  FL:       65.9  mm     G. Age:  34w 0d          42  %    FL/AC:      20.0   %    20 - 24  LV:        3.8  mm  Est. FW:    2737  gm      6 lb 1 oz     90  % ---------------------------------------------------------------------- OB History  Gravidity:    2         Term:   1  Living:       1 ---------------------------------------------------------------------- Gestational Age  U/S Today:     35w 1d                                        EDD:   11/04/23  Best:          33w 6d     Det. By:  Early Ultrasound         EDD:   11/13/23 ---------------------------------------------------------------------- Anatomy  Cranium:               Previously seen        Aortic Arch:            Previously seen  Cavum:                 Previously seen        Ductal Arch:            Previously seen  Ventricles:            Appears normal         Diaphragm:              Appears normal  Choroid Plexus:        Previously seen        Stomach:                Appears normal, left                                                                        sided  Cerebellum:            Previously seen        Abdomen:                Previously seen  Posterior Fossa:       Previously seen        Abdominal  Wall:         Previously seen  Face:                  Orbits and profile     Cord Vessels:           Previously seen                         previously seen  Lips:                  Previously seen        Kidneys:                Appear normal  Thoracic:              Previously seen        Bladder:                Appears normal  Heart:                 Appears normal         Spine:                  Previously seen                         (4CH, axis, and                         situs)  RVOT:                  Previously seen        Upper Extremities:      Previously seen  LVOT:                  Previously seen        Lower Extremities:      Previously seen ---------------------------------------------------------------------- Impression  Maternal Crohn's disease.  Stable without medications.  Patient  does not have gestational diabetes.  On today's ultrasound, the estimated fetal weight is at the  90th percentile and the abdominal circumference  measurement at the 99th percentile.  Amniotic fluid is normal  good fetal activity seen.  Cephalic presentation.  I reassured the patient of the findings. ---------------------------------------------------------------------- Recommendations  - An appointment was made for her to return 4 weeks for fetal  growth assessment (estimation of fetal weight). ----------------------------------------------------------------------                 Fredia Fresh, MD Electronically Signed Final Report   10/01/2023 03:52 pm ----------------------------------------------------------------------    MAU Course: Orders Placed This Encounter  Procedures   Group B strep by PCR   US  MFM OB LIMITED   Protein / creatinine ratio, urine   Comprehensive metabolic panel   OB RESULTS CONSOLE RPR   OB RESULTS CONSOLE HIV antibody   OB RESULTS CONSOLE Hepatitis B surface antigen   CBC   Uric acid   Lactate dehydrogenase   RPR   Rubella screen   Diet clear liquid Room service appropriate? Yes; Fluid consistency: Thin   Notify physician (specify) Confirmatory reading of BP> 160/110 15 minutes later   Apply Hypertensive Disorders of Pregnancy Care Plan   Measure blood pressure   Strict intake and output   Vitals signs per unit policy   Notify physician (specify)   Fetal monitoring per unit policy   Activity as tolerated  Cervical Exam   Measure blood pressure post delivery every 15 min x 1 hour then every 30 min x 1 hour   Fundal check post delivery every 15 min x 1 hour then every 30 min x 1 hour   Apply Labor & Delivery Care Plan   If Rapid HIV test positive or known HIV positive: initiate AZT orders   May in and out cath x 2 for inability to void   Insert urethral catheter X 1 PRN If Coude Catheter is chosen, qualified resources by campus can be found in the clinical  skills nursing procedure for Coude Catheter 1. If straight catheterized > 2 times or patient unable to void post epidural plac...   Refer to Sidebar Report Urinary (Foley) Catheter Indications   Refer to Sidebar Report Post Indwelling Urinary Catheter Removal and Intervention Guidelines   Discontinue foley prior to vaginal delivery   Initiate Oral Care Protocol   Initiate Carrier Fluid Protocol   Patient may have epidural placement upon request   Informed Consent Details: Physician/Practitioner Attestation; Transcribe to consent form and obtain patient signature   Informed Consent Details: Physician/Practitioner Attestation; Transcribe to consent form and obtain patient signature   SCDs   Evaluate fetal heart rate to establish reassuring pattern prior to initiating Cytotec or Pitocin    Perform a cervical exam prior to initiating Cytotec or Pitocin    Discontinue Pitocin  if tachysystole with non-reassuring FHR is present   Notify physician (specify) Tachysystole is defined as more than 5 contractions in a 10-minute time period averaged over a 30-minute window   Initiate intrauterine resuscitation if tachysystole with non-reassuring FHR is present   Notify physician (specify) Tachysystole is defined as more than 5 contractions in a 10-minute time period averaged over a 30-minute window   May administer Terbutaline  0.25 mg SQ x 1 dose if tachysystole with non-reassuring FHR is present   Labor Induction   Full code   Consult to neonatology Reason for Consult? preE w/SF vaginal bleeding   Type and screen [facility]   Prepare RBC (crossmatch)   Insert and maintain IV Line   Admit to Inpatient (patient's expected length of stay will be greater than 2 midnights or inpatient only procedure)   Admit to Inpatient (patient's expected length of stay will be greater than 2 midnights or inpatient only procedure)   Meds ordered this encounter  Medications   AND Linked Order Group     labetalol  (NORMODYNE ) injection 20 mg    labetalol  (NORMODYNE ) injection 40 mg    labetalol  (NORMODYNE ) injection 80 mg    hydrALAZINE  (APRESOLINE ) injection 10 mg   magnesium  bolus via infusion 4 g   magnesium  sulfate 40 grams in SWI 1000 mL OB infusion   lactated ringers  infusion   lactated ringers  infusion   oxytocin  (PITOCIN ) IV BOLUS FROM BAG   oxytocin  (PITOCIN ) IV infusion 30 units in NS 500 mL - Premix   lactated ringers  infusion 500-1,000 mL   acetaminophen  (TYLENOL ) tablet 650 mg   oxyCODONE -acetaminophen  (PERCOCET/ROXICET) 5-325 MG per tablet 1 tablet   oxyCODONE -acetaminophen  (PERCOCET/ROXICET) 5-325 MG per tablet 2 tablet   ondansetron  (ZOFRAN ) injection 4 mg   sodium citrate -citric acid  (ORACIT) solution 30 mL   lidocaine  (PF) (XYLOCAINE ) 1 % injection 30 mL   fentaNYL  (SUBLIMAZE ) injection 50-100 mcg   DISCONTD: betamethasone  acetate-betamethasone  sodium phosphate (CELESTONE ) injection 12 mg   0.9 %  sodium chloride  infusion (Manually program via Guardrails IV Fluids)   betamethasone  acetate-betamethasone  sodium phosphate (  CELESTONE ) injection 12 mg   terbutaline  (BRETHINE ) injection 0.25 mg   oxytocin  (PITOCIN ) IV infusion 30 units in NS 500 mL - Premix    Begin infusion at::   1 milli-unit/min (1 mL/hr)    Increase infusion by::   1 milli-unit/min (1 mL/hr)    Assessment/Plan: [name] is a 38 y.o. female, G2P1001, IUP at 35.4 weeks, presenting for IOL for was seen in MAU with vaginal bleeding, and noted to have new onset Preeclampsia with severe features. Suspected partial placenta abruption, US  appeared unremarkable and vxt presentation. PCR Pending, lab unremarkable. GBS-. H/O Chrones in remission. LGA during this pregnancy. AMA. Pt endorse + Fm. Denies vaginal leakage. Denies feeling cxt's  FWB: Cat 1 Fetal Tracing.   Plan: Admit to Birthing Suite per consult with Dr Henry Routine [facility] orders Pain med/epidural prn NICU consult PreE with SF: monitor  BP, labetalol  for SR BP >160/110, mag 4gm loading bolus, and 2gm/hr.  BMZ GBS IOL start with pitocin  and foley bulb.  Anticipate labor progression  Rubella Unknown: Labs placed.   Lovelle Deitrick  CNM, FNP-C, PMHNP-BC  [address]  [place], [place] [address]  Cell: [phone]  Office Phone: [phone] Fax: [phone] 10/12/2023  1:38 PM

## 2023-10-12 NOTE — Progress Notes (Signed)
 [name] is a 38 y.o. G2P1001 at [redacted]w[redacted]d admitted for pre-eclampsia w/severe features by BP and probable partial abruption and good FHTs.    Subjective: Denies HA, visual changes or abdominal pain.  Also denies ctx, LOF and reports FM.  Objective: BP 137/89   Pulse (!) 104   Temp 98.5 F (36.9 C) (Oral)   Resp 20   Ht 5' 8 (1.727 m)   Wt 113.9 kg   LMP 01/26/2023 (Within Weeks)   SpO2 100%   BMI 38.16 kg/m  No intake/output data recorded. No intake/output data recorded.  FHT:  FHR: 150 bpm, variability: moderate,  accelerations:  Present,  decelerations:  Absent UC:   infrequent SVE:    closed and long Limited ultrasound no obvious abruption and cephalic SSE: no active bleeding, small amount of dark red blood in vault  Labs: Lab Results  Component Value Date   WBC 10.4 06/09/2023   HGB 11.5 (L) 06/09/2023   HCT 33.1 (L) 06/09/2023   MCV 88.7 06/09/2023   PLT 261 06/09/2023    Assessment / Plan: 38yo G2P1001 at 35 4/7wks being induced for pre-e w/SF by BP and partial abruption.    Labor: Will start induction with pitocin  given greater control than cytotec. Preeclampsia:  on magnesium  sulfate. Asymptomatic currently s/p IV labetalol . Fetal Wellbeing:  Category I.  Discussed options of BMZ given less than 36wks and pt would like to proceed with that and understands baby may go to NICU. Pain Control:  pain medication upon request. I/D:  GBS by PCR pending Anticipated MOD:  Unsure.  I discussed options of c/s given partial abruption and remote from delivery and possible need for emergent c/s if there is maternal or fetal compromise.  Pt understands and would like to start with induction and would like to avoid c-section if possible. Risks benefits an alternatives reviewed and questions answered.  She has decided not to have a sterilization procedure and plans circumcision for a baby boy.   Jon CINDERELLA Rummer, MD 10/12/2023, 12:38 PM

## 2023-10-12 NOTE — MAU Note (Signed)
..  [name] is a 38 y.o. at [redacted]w[redacted]d here in MAU reporting: gush of blood and clots at 1045 denies contraction or leaking fluid.   Endorses +FM.   Denies hx of htn/preeclampsia. Denies HA, blurred vision, RUQ pain, SOB or increased swelling.   Pain score: no pain Vitals:   10/12/23 1120 10/12/23 1130  BP: (!) 181/104 (!) 160/89  Pulse: (!) 140 (!) 119  Resp: (!) 28   Temp: 98.4 F (36.9 C)   SpO2: 99% 99%     FHT:150  Lab orders placed from triage:

## 2023-10-13 ENCOUNTER — Encounter (HOSPITAL_COMMUNITY): Payer: Self-pay

## 2023-10-13 ENCOUNTER — Encounter (HOSPITAL_COMMUNITY): Payer: Self-pay | Admitting: Obstetrics and Gynecology

## 2023-10-13 LAB — COMPREHENSIVE METABOLIC PANEL WITH GFR
ALT: 15 U/L (ref 0–44)
AST: 27 U/L (ref 15–41)
Albumin: 2.9 g/dL — ABNORMAL LOW (ref 3.5–5.0)
Alkaline Phosphatase: 92 U/L (ref 38–126)
Anion gap: 13 (ref 5–15)
BUN: 7 mg/dL (ref 6–20)
CO2: 18 mmol/L — ABNORMAL LOW (ref 22–32)
Calcium: 8.6 mg/dL — ABNORMAL LOW (ref 8.9–10.3)
Chloride: 103 mmol/L (ref 98–111)
Creatinine, Ser: 0.74 mg/dL (ref 0.44–1.00)
GFR, Estimated: >60 mL/min (ref >60)
Glucose, Bld: 121 mg/dL — ABNORMAL HIGH (ref 70–99)
Potassium: 4.9 mmol/L (ref 3.5–5.1)
Sodium: 134 mmol/L — ABNORMAL LOW (ref 135–145)
Total Bilirubin: 0.8 mg/dL (ref 0.0–1.2)
Total Protein: 6.6 g/dL (ref 6.5–8.1)

## 2023-10-13 LAB — CBC
HCT: 38.3 % (ref 36.0–46.0)
Hemoglobin: 13.2 g/dL (ref 12.0–15.0)
MCH: 31.2 pg (ref 26.0–34.0)
MCHC: 34.5 g/dL (ref 30.0–36.0)
MCV: 90.5 fL (ref 80.0–100.0)
Platelets: 224 K/uL (ref 150–400)
RBC: 4.23 MIL/uL (ref 3.87–5.11)
RDW: 13.3 % (ref 11.5–15.5)
WBC: 15.7 K/uL — ABNORMAL HIGH (ref 4.0–10.5)
nRBC: 0 % (ref 0.0–0.2)

## 2023-10-13 LAB — MAGNESIUM: Magnesium: 4.8 mg/dL — ABNORMAL HIGH (ref 1.7–2.4)

## 2023-10-13 LAB — RPR: RPR Ser Ql: NONREACTIVE

## 2023-10-13 MED ORDER — TRANEXAMIC ACID-NACL 1000-0.7 MG/100ML-% IV SOLN
INTRAVENOUS | Status: AC
  Administered 2023-10-13: 1000 mg via INTRAVENOUS
  Filled 2023-10-13: qty 100

## 2023-10-13 MED ORDER — DIBUCAINE (PERIANAL) 1 % EX OINT
1.0000 | TOPICAL_OINTMENT | CUTANEOUS | Status: DC | PRN
Start: 2023-10-13 — End: 2023-10-16

## 2023-10-13 MED ORDER — NIFEDIPINE ER OSMOTIC RELEASE 30 MG PO TB24
30.0000 mg | ORAL_TABLET | Freq: Every day | ORAL | Status: DC
  Administered 2023-10-13 – 2023-10-15 (×3): 30 mg via ORAL
  Filled 2023-10-13 (×3): qty 1

## 2023-10-13 MED ORDER — IBUPROFEN 600 MG PO TABS
600.0000 mg | ORAL_TABLET | Freq: Four times a day (QID) | ORAL | Status: DC
  Administered 2023-10-13 – 2023-10-16 (×12): 600 mg via ORAL
  Filled 2023-10-13 (×12): qty 1

## 2023-10-13 MED ORDER — ONDANSETRON HCL 4 MG PO TABS: 4.0000 mg | ORAL_TABLET | ORAL | Status: DC | PRN

## 2023-10-13 MED ORDER — SENNOSIDES-DOCUSATE SODIUM 8.6-50 MG PO TABS
2.0000 | ORAL_TABLET | Freq: Every day | ORAL | Status: DC
Start: 2023-10-14 — End: 2023-10-16
  Administered 2023-10-14: 2 via ORAL
  Filled 2023-10-13 (×2): qty 2

## 2023-10-13 MED ORDER — BENZOCAINE-MENTHOL 20-0.5 % EX AERO: 1.0000 | INHALATION_SPRAY | CUTANEOUS | Status: DC | PRN

## 2023-10-13 MED ORDER — ONDANSETRON HCL 4 MG/2ML IJ SOLN: 4.0000 mg | INTRAMUSCULAR | Status: DC | PRN

## 2023-10-13 MED ORDER — SIMETHICONE 80 MG PO CHEW
80.0000 mg | CHEWABLE_TABLET | ORAL | Status: DC | PRN
  Administered 2023-10-14: 80 mg via ORAL
  Filled 2023-10-13: qty 1

## 2023-10-13 MED ORDER — TRANEXAMIC ACID-NACL 1000-0.7 MG/100ML-% IV SOLN: 1000.0000 mg | INTRAVENOUS | Status: AC

## 2023-10-13 MED ORDER — PRENATAL MULTIVITAMIN CH
1.0000 | ORAL_TABLET | Freq: Every day | ORAL | Status: DC
  Administered 2023-10-13 – 2023-10-15 (×3): 1 via ORAL
  Filled 2023-10-13 (×3): qty 1

## 2023-10-13 MED ORDER — DIPHENHYDRAMINE HCL 25 MG PO CAPS: 25.0000 mg | ORAL_CAPSULE | Freq: Four times a day (QID) | ORAL | Status: DC | PRN

## 2023-10-13 MED ORDER — TETANUS-DIPHTH-ACELL PERTUSSIS 5-2.5-18.5 LF-MCG/0.5 IM SUSY: 0.5000 mL | PREFILLED_SYRINGE | Freq: Once | INTRAMUSCULAR | Status: DC

## 2023-10-13 MED ORDER — ZOLPIDEM TARTRATE 5 MG PO TABS
5.0000 mg | ORAL_TABLET | Freq: Every evening | ORAL | Status: DC | PRN
  Administered 2023-10-13 – 2023-10-14 (×2): 5 mg via ORAL
  Filled 2023-10-13 (×3): qty 1

## 2023-10-13 MED ORDER — WITCH HAZEL-GLYCERIN EX PADS: 1.0000 | MEDICATED_PAD | CUTANEOUS | Status: DC | PRN

## 2023-10-13 MED ORDER — ACETAMINOPHEN 325 MG PO TABS
650.0000 mg | ORAL_TABLET | ORAL | Status: DC | PRN
  Administered 2023-10-13: 650 mg via ORAL
  Filled 2023-10-13: qty 2

## 2023-10-13 MED ORDER — COCONUT OIL OIL: 1.0000 | TOPICAL_OIL | Status: DC | PRN

## 2023-10-13 NOTE — Lactation Note (Signed)
 This note was copied from a baby's chart. Lactation Consultation Note  Patient Name: [name] Unijb'd Date: 10/13/2023 Age:36 hours Reason for consult: Initial assessment;Late-preterm 34-36.6wks;Other (Comment);Exclusive pumping and bottle feeding;RN request;1st time breastfeeding (LGA, AMA)  Visited with family of 84 61/7 weeks old AGA NICU female [name]; Ms. Merlin is a P2 but this is her first time breastfeeding, her first child is now 17 y.o. She has taken baby to breast but her plan is to exclusively pump and bottle feed as she associates the breast with sexual arousal and doesn't feel quite comfortable putting baby to breast. Explained that exclusively pumping and bottle is still a great way to provide [name] with some of her EBM. She already started pumping, but didn't get any colostrum; explained that the purpose of pumping this early on is mainly for breast stimulation and not to get volume, she voiced understanding. Reviewed normal LPI behavior, feeding cues, cluster feeding, size of baby's stomach, pumping schedule, pumping log, supplementation and anticipatory guidelines. Baby is being supplemented with Similac 22 calorie formula.   Maternal Data Has patient been taught Hand Expression?: Yes Does the patient have breastfeeding experience prior to this delivery?: No  Feeding Mother's Current Feeding Choice: Breast Milk and Formula Nipple Type: Nfant Standard Flow (white)  LATCH Score Latch: Too sleepy or reluctant, no latch achieved, no sucking elicited.  Audible Swallowing: None  Type of Nipple: Everted at rest and after stimulation  Comfort (Breast/Nipple): Soft / non-tender  Hold (Positioning): Assistance needed to correctly position infant at breast and maintain latch.  LATCH Score: 5  Lactation Tools Discussed/Used Tools: Pump;Flanges Flange Size: 21 Breast pump type: Double-Electric Breast Pump Pump Education: Setup, frequency, and cleaning;Milk  Storage Reason for Pumping: patient's choice, LPI Pumping frequency: initiated pumping at 7 hours post-partum Pumped volume: 0 mL  Interventions Interventions: Breast feeding basics reviewed;Education;DEBP;LC Services brochure;NICU Pumping Log;LPT handout/interventions  Plan STS whenever possible Massage and hand express both breasts prior/after pumping, coconut oil prior to pumping Pump both breasts on initiate mode every 3 hours for 15 minutes, ideally 8 pumping sessions/24 hours Supplement baby with EBM/Similac 22 calorie formula every 3 hours, > 14 ml the first 24 hours and then 21 ml on day 2  No other support person at this time. All questions and concerns answered, family to contact [facility] services PRN.  Discharge Pump: Manual;Personal;Hands Free (Lansinoh wearable and Medela hand pump)  Consult Status Consult Status: Follow-up Date: 10/14/23 Follow-up type: In-patient    Salma Walrond GORMAN Crate 10/13/2023, 5:49 PM

## 2023-10-13 NOTE — Progress Notes (Signed)
 [name] is a 38 y.o. G2P1001 at [redacted]w[redacted]d  Subjective: Denies HA, visual changes or abdominal pain.  Discussed AROM and pt is agreeable.  Objective: BP 135/65   Pulse 94   Temp 98.2 F (36.8 C) (Oral)   Resp 18   Ht 5' 8 (1.727 m)   Wt 113.9 kg   LMP 01/26/2023 (Within Weeks)   SpO2 100%   BMI 38.16 kg/m  I/O last 3 completed shifts: In: 2593.9 [P.O.:1520; I.V.:1073.9] Out: 1275 [Urine:1275] Total I/O In: -  Out: 2200 [Urine:2200]  FHT:  FHR: 125 bpm, variability: moderate,  accelerations:  Present,  decelerations:  Absent UC:   q 3-64min SVE:   Dilation: 4-5 Effacement (%): 70 Station: -2 Exam by:: RONAL Rummer, MD AROM slightly blood tinged  Labs: Lab Results  Component Value Date   WBC 9.7 10/12/2023   HGB 13.9 10/12/2023   HCT 42.4 10/12/2023   MCV 94.6 10/12/2023   PLT 240 10/12/2023    Assessment / Plan: Induction of labor due to preeclampsia and abruptio placentae,  progressing well on pitocin   Labor: s/p AROM.  Cont to titrate pitocin  as indicated. Preeclampsia:  on magnesium  sulfate and no signs or symptoms of toxicity Fetal Wellbeing:  Category I Pain Control:  pain medication upon request I/D:  GBS presumed negative but will start PCN for prematurity given presumptive neg Anticipated MOD:  NSVD  Jon CINDERELLA Rummer, MD 10/13/2023, 12:38 AM

## 2023-10-13 NOTE — Progress Notes (Signed)
 VE 5/70/-1 IUPC placed Cat 1 tracing MVUs around 120 VSS Repeat labs Titrate pitocin  as indicated

## 2023-10-14 LAB — CBC
HCT: 33.2 % — ABNORMAL LOW (ref 36.0–46.0)
Hemoglobin: 11.4 g/dL — ABNORMAL LOW (ref 12.0–15.0)
MCH: 31.9 pg (ref 26.0–34.0)
MCHC: 34.3 g/dL (ref 30.0–36.0)
MCV: 93 fL (ref 80.0–100.0)
Platelets: 215 K/uL (ref 150–400)
RBC: 3.57 MIL/uL — ABNORMAL LOW (ref 3.87–5.11)
RDW: 13.6 % (ref 11.5–15.5)
WBC: 17.1 K/uL — ABNORMAL HIGH (ref 4.0–10.5)
nRBC: 0 % (ref 0.0–0.2)

## 2023-10-14 LAB — COMPREHENSIVE METABOLIC PANEL WITH GFR
ALT: 14 U/L (ref 0–44)
AST: 25 U/L (ref 15–41)
Albumin: 2.5 g/dL — ABNORMAL LOW (ref 3.5–5.0)
Alkaline Phosphatase: 80 U/L (ref 38–126)
Anion gap: 14 (ref 5–15)
BUN: 10 mg/dL (ref 6–20)
CO2: 19 mmol/L — ABNORMAL LOW (ref 22–32)
Calcium: 7.6 mg/dL — ABNORMAL LOW (ref 8.9–10.3)
Chloride: 103 mmol/L (ref 98–111)
Creatinine, Ser: 0.73 mg/dL (ref 0.44–1.00)
GFR, Estimated: >60 mL/min (ref >60)
Glucose, Bld: 106 mg/dL — ABNORMAL HIGH (ref 70–99)
Potassium: 4.4 mmol/L (ref 3.5–5.1)
Sodium: 136 mmol/L (ref 135–145)
Total Bilirubin: 0.5 mg/dL (ref 0.0–1.2)
Total Protein: 5.5 g/dL — ABNORMAL LOW (ref 6.5–8.1)

## 2023-10-14 LAB — MAGNESIUM: Magnesium: 5 mg/dL — ABNORMAL HIGH (ref 1.7–2.4)

## 2023-10-14 NOTE — Lactation Note (Signed)
 This note was copied from a baby's chart. Lactation Consultation Note  Patient Name: [name] Unijb'd Date: 10/14/2023 Age:36 hours Reason for consult: Follow-up assessment;Late-preterm 34-36.6wks;Exclusive pumping and bottle feeding;Hyperbilirubinemia;1st time breastfeeding;Other (Comment) (LGA, AMA)  Visited with family of 36 72/74 weeks old female [name]; Ms. Bracher is a P2 and reported she hasn't had a chance to pump since the last time she saw LC; baby was placed under phototherapy this morning and it's been a stressful day for her. She voiced she would like to try again, resized her flanges to # 18 and fit her with a pumping band in size XL for hands on pumping; she started pumping during [facility] consult, praised her for all her efforts. Educated family on how babies with high bilirubin might not be the best feeders; and explained that pumping will protect her supply in the meantime. Revised pumping schedule, CDC, supplementation and anticipatory guidelines.   Feeding Mother's Current Feeding Choice: Breast Milk and Formula Nipple Type: Nfant Standard Flow (white)  Lactation Tools Discussed/Used Tools: Pump;Flanges;Coconut oil;Hands-free pumping top Flange Size: 18 (Resized to # 18 on 10/14/2023) Breast pump type: Double-Electric Breast Pump Pump Education: Setup, frequency, and cleaning;Milk Storage Reason for Pumping: patient's choice, LPI Pumping frequency: Has not pumped since the last time she saw lactation Pumped volume: 0 mL  Plan STS once able to Massage and hand express both breasts prior/after pumping, coconut oil prior to pumping Pump both breasts on initiate mode every 3 hours for 15 minutes, ideally 8 pumping sessions/24 hours Supplement baby with EBM/Similac 22 calorie formula every 3 hours, > 21 ml today and > 28 ml on day 3   MGM present and supportive. All questions and concerns answered, family to contact [facility] services PRN.  Interventions Interventions: Breast  feeding basics reviewed;Coconut oil;DEBP;Education;CDC milk storage guidelines;CDC Guidelines for Breast Pump Cleaning   Consult Status Consult Status: Follow-up Date: 10/15/23 Follow-up type: In-patient   Erla Bacchi GORMAN Crate 10/14/2023, 4:15 PM

## 2023-10-14 NOTE — Plan of Care (Signed)
  Problem: Education: Goal: Knowledge of disease or condition will improve Outcome: Progressing Goal: Knowledge of the prescribed therapeutic regimen will improve Outcome: Progressing   Problem: Fluid Volume: Goal: Peripheral tissue perfusion will improve Outcome: Progressing   Problem: Clinical Measurements: Goal: Complications related to disease process, condition or treatment will be avoided or minimized Outcome: Progressing   Problem: Education: Goal: Knowledge of General Education information will improve Description: Including pain rating scale, medication(s)/side effects and non-pharmacologic comfort measures Outcome: Progressing   Problem: Health Behavior/Discharge Planning: Goal: Ability to manage health-related needs will improve Outcome: Progressing   Problem: Clinical Measurements: Goal: Ability to maintain clinical measurements within normal limits will improve Outcome: Progressing Goal: Will remain free from infection Outcome: Progressing Goal: Diagnostic test results will improve Outcome: Progressing Goal: Respiratory complications will improve Outcome: Progressing Goal: Cardiovascular complication will be avoided Outcome: Progressing   Problem: Activity: Goal: Risk for activity intolerance will decrease Outcome: Progressing   Problem: Nutrition: Goal: Adequate nutrition will be maintained Outcome: Progressing   Problem: Coping: Goal: Level of anxiety will decrease Outcome: Progressing   Problem: Elimination: Goal: Will not experience complications related to bowel motility Outcome: Progressing Goal: Will not experience complications related to urinary retention Outcome: Progressing   Problem: Pain Managment: Goal: General experience of comfort will improve and/or be controlled Outcome: Progressing   Problem: Safety: Goal: Ability to remain free from injury will improve Outcome: Progressing   Problem: Skin Integrity: Goal: Risk for impaired  skin integrity will decrease Outcome: Progressing   Problem: Education: Goal: Knowledge of condition will improve Outcome: Progressing   Problem: Activity: Goal: Will verbalize the importance of balancing activity with adequate rest periods Outcome: Progressing Goal: Ability to tolerate increased activity will improve Outcome: Progressing   Problem: Coping: Goal: Ability to identify and utilize available resources and services will improve Outcome: Progressing   Problem: Life Cycle: Goal: Chance of risk for complications during the postpartum period will decrease Outcome: Progressing   Problem: Role Relationship: Goal: Ability to demonstrate positive interaction with newborn will improve Outcome: Progressing   Problem: Skin Integrity: Goal: Demonstration of wound healing without infection will improve Outcome: Progressing

## 2023-10-14 NOTE — Progress Notes (Signed)
 Post Partum Day1 Subjective: no complaints, voiding and tolerating PO and breast feeding. NO HA OR RUQ PAIN  Objective: Afebrile VSS  Physical Exam:  General: alert Lochia: appropriate Uterine Fundus: firm  DVT Evaluation: No evidence of DVT seen on physical exam.  Recent Labs    10/13/23 0504 10/14/23 0423  HGB 13.2 11.4*  HCT 38.3 33.2*      Latest Ref Rng & Units 10/14/2023    6:52 AM 10/13/2023    5:04 AM 10/12/2023   11:23 AM  CMP  Glucose 70 - 99 mg/dL 893  878  886   BUN 6 - 20 mg/dL 10  7  8    Creatinine 0.44 - 1.00 mg/dL 9.26  9.25  9.28   Sodium 135 - 145 mmol/L 136  134  133   Potassium 3.5 - 5.1 mmol/L 4.4  4.9  3.6   Chloride 98 - 111 mmol/L 103  103  104   CO2 22 - 32 mmol/L 19  18  16    Calcium 8.9 - 10.3 mg/dL 7.6  8.6  9.6   Total Protein 6.5 - 8.1 g/dL 5.5  6.6  6.6   Total Bilirubin 0.0 - 1.2 mg/dL 0.5  0.8  0.5   Alkaline Phos 38 - 126 U/L 80  92  102   AST 15 - 41 U/L 25  27  22    ALT 0 - 44 U/L 14  15  15       Assessment/Plan: PPD 1 Pt BREAST feeding Routine care PREECLMPSIA BP CONTROLLED.  MONITOR CLOSELY   LOS: 2 days   [name] 10/14/2023, 2:20 PM

## 2023-10-15 LAB — RUBELLA SCREEN: Rubella: 3.07 {index} (ref >0.99)

## 2023-10-15 MED ORDER — NIFEDIPINE ER OSMOTIC RELEASE 60 MG PO TB24
60.0000 mg | ORAL_TABLET | Freq: Every day | ORAL | Status: DC
  Administered 2023-10-16: 60 mg via ORAL
  Filled 2023-10-15: qty 1

## 2023-10-15 MED ORDER — FUROSEMIDE 20 MG PO TABS
10.0000 mg | ORAL_TABLET | Freq: Two times a day (BID) | ORAL | Status: AC
  Administered 2023-10-15 – 2023-10-16 (×2): 10 mg via ORAL
  Filled 2023-10-15 (×2): qty 1

## 2023-10-15 MED ORDER — LOPERAMIDE HCL 2 MG PO CAPS
2.0000 mg | ORAL_CAPSULE | ORAL | Status: DC | PRN
  Administered 2023-10-15: 2 mg via ORAL
  Filled 2023-10-15: qty 1

## 2023-10-15 MED ORDER — FUROSEMIDE 20 MG PO TABS: 10.0000 mg | ORAL_TABLET | Freq: Two times a day (BID) | ORAL | Status: DC

## 2023-10-15 MED ORDER — NIFEDIPINE ER OSMOTIC RELEASE 30 MG PO TB24
30.0000 mg | ORAL_TABLET | Freq: Once | ORAL | Status: AC
  Administered 2023-10-15: 30 mg via ORAL
  Filled 2023-10-15: qty 1

## 2023-10-15 NOTE — Progress Notes (Addendum)
 Post Partum Day 2 Subjective: up ad lib, voiding, tolerating PO, + flatus, and +BM.  C/o swelling in legs.  Denies HA visual changes or abdominal pain.  Objective: Blood pressure (!) 142/81, pulse 93, temperature 98.1 F (36.7 C), temperature source Oral, resp. rate 18, height 5' 8 (1.727 m), weight 113.9 kg, last menstrual period 01/26/2023, SpO2 98%, unknown if currently breastfeeding.  Physical Exam:  General: alert, cooperative, and no distress Lochia: appropriate Uterine Fundus: firm Incision: n/a DVT Evaluation: No evidence of DVT seen on physical exam. No cords or calf tenderness. 1+ edema  Recent Labs    10/13/23 0504 10/14/23 0423  HGB 13.2 11.4*  HCT 38.3 33.2*    Assessment/Plan: Plan for discharge tomorrow, Breastfeeding, and Circumcision prior to discharge s/p Mg for pre-e with SF. Baby is under bililights and not being discharged today Pt's BP in mild range, will order another dose of procardia  xl for now and increase to 60mg  daily Swelling in LEs - will order lasix  Will cont to observe BPs overnight  LOS: 3 days   Jon CINDERELLA Rummer, MD 10/15/2023, 1:40 PM

## 2023-10-15 NOTE — Plan of Care (Signed)
  Problem: Education: Goal: Knowledge of disease or condition will improve Outcome: Progressing Goal: Knowledge of the prescribed therapeutic regimen will improve Outcome: Progressing   Problem: Fluid Volume: Goal: Peripheral tissue perfusion will improve Outcome: Progressing   Problem: Clinical Measurements: Goal: Complications related to disease process, condition or treatment will be avoided or minimized Outcome: Progressing   Problem: Education: Goal: Knowledge of General Education information will improve Description: Including pain rating scale, medication(s)/side effects and non-pharmacologic comfort measures Outcome: Progressing   Problem: Health Behavior/Discharge Planning: Goal: Ability to manage health-related needs will improve Outcome: Progressing   Problem: Clinical Measurements: Goal: Ability to maintain clinical measurements within normal limits will improve Outcome: Progressing Goal: Will remain free from infection Outcome: Progressing Goal: Diagnostic test results will improve Outcome: Progressing Goal: Respiratory complications will improve Outcome: Progressing Goal: Cardiovascular complication will be avoided Outcome: Progressing   Problem: Activity: Goal: Risk for activity intolerance will decrease Outcome: Progressing   Problem: Nutrition: Goal: Adequate nutrition will be maintained Outcome: Progressing   Problem: Coping: Goal: Level of anxiety will decrease Outcome: Progressing   Problem: Elimination: Goal: Will not experience complications related to bowel motility Outcome: Progressing Goal: Will not experience complications related to urinary retention Outcome: Progressing   Problem: Pain Managment: Goal: General experience of comfort will improve and/or be controlled Outcome: Progressing   Problem: Safety: Goal: Ability to remain free from injury will improve Outcome: Progressing   Problem: Skin Integrity: Goal: Risk for impaired  skin integrity will decrease Outcome: Progressing   Problem: Education: Goal: Knowledge of condition will improve Outcome: Progressing   Problem: Activity: Goal: Will verbalize the importance of balancing activity with adequate rest periods Outcome: Progressing Goal: Ability to tolerate increased activity will improve Outcome: Progressing   Problem: Coping: Goal: Ability to identify and utilize available resources and services will improve Outcome: Progressing   Problem: Life Cycle: Goal: Chance of risk for complications during the postpartum period will decrease Outcome: Progressing   Problem: Role Relationship: Goal: Ability to demonstrate positive interaction with newborn will improve Outcome: Progressing   Problem: Skin Integrity: Goal: Demonstration of wound healing without infection will improve Outcome: Progressing

## 2023-10-15 NOTE — Discharge Summary (Signed)
 SVD OB Discharge Summary       Patient Name: [name] DOB: 1985/04/24 MRN: [id number]  Date of admission: 10/12/2023 Delivering MD: JOSHUA PALMA K Date of delivery: 10/13/2023 Type of delivery: Preterm delivery  Newborn Data: Sex: Baby female Circumcision: in pt prior to discharge Live born female  Birth Weight: 6 lb 11 oz (3033 g) APGAR: 6, 9  Newborn Delivery   Birth date/time: 10/13/2023 09:17:00 Delivery type: Vaginal, Spontaneous     Feeding: breast and bottle, baby in NICU Infant being discharge to home with mother in stable condition.   Admitting diagnosis: Vaginal bleeding in pregnancy, third trimester [O46.93] Preeclampsia, severe [O14.10] Intrauterine pregnancy: [redacted]w[redacted]d     Secondary diagnosis:  Principal Problem:   Postpartum care following vaginal delivery 10/13/2023 Active Problems:   Crohn's disease (HCC)   AMA (advanced maternal age) primigravida 35+   Vaginal bleeding in pregnancy, third trimester   Preeclampsia, severe   SVD (spontaneous vaginal delivery)   Preterm delivery                                Complications: None                                                              Intrapartum Procedures: spontaneous vaginal delivery Postpartum Procedures: none Complications-Operative and Postpartum: none Augmentation: AROM, Pitocin , and IP Foley   History of Present Illness: [name] is a 38 y.o. female, G2P1102, who presents at [redacted]w[redacted]d weeks gestation. The patient has been followed at  [facility] and Gynecology  Her pregnancy has been complicated by:  Patient Active Problem List   Diagnosis Date Noted   Preterm delivery 10/15/2023   SVD (spontaneous vaginal delivery) 10/13/2023   Postpartum care following vaginal delivery 10/13/2023 10/13/2023   Vaginal bleeding in pregnancy, third trimester 10/12/2023   Preeclampsia, severe 10/12/2023   AMA (advanced maternal age) primigravida 35+ 06/13/2023   Crohn's disease  (HCC) 06/09/2023     Active Ambulatory Problems    Diagnosis Date Noted   Crohn's disease (HCC) 06/09/2023   AMA (advanced maternal age) primigravida 35+ 06/13/2023   Resolved Ambulatory Problems    Diagnosis Date Noted   Bilateral leg pain 06/09/2023   Obesity affecting pregnancy, antepartum 06/13/2023   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 yo [id number] at [redacted]w[redacted]d was admitted to the hospital 10/12/2023 for induction of labor.  Indication for induction: Preeclampsia.  Patient had an labor course complicated by pt was admitted at 35.4 for new onset PreE with SF, severe range BP, and vaginal bleeding with suspected placenta abruption. Pt was tx with IV labetalol  and placed on mag prior to delivery, PCR was 0.79. Pt was induced with IP foley, pitocin  and AROM and had SVD on 9/1 over an intact perineum with EBL of , hgb drop of13.2-13.9-11.5, asymptomatic.  Membrane Rupture Time/Date: 12:35 AM,10/13/2023  Delivery Method:Vaginal, Spontaneous Operative Delivery:N/A Episiotomy: None Lacerations:  None Details of delivery  can be found in separate delivery note.  Patient had a postpartum course complicated by pt remained on magnesium  PP for 24 hours, then was started on procardia  with it being increased to 60mg  XL 9/3 for elevated Bps. Current BP 131/85. Pt was given x2 doses lasix  10mg  PO for low extremities swelling, and all remained unremarkable PP. Order SW consult r/t hisotory of traumatic event with episode of anxiety and depression. . Patient is discharged home 10/16/23.  Newborn Data: Birth date:10/13/2023 Birth time:9:17 AM Gender:Female Living status:Living Apgars:6 ,9  Weight:3033 g Postpartum Day # 3 : Patient up ad lib, denies syncope or dizziness.  Reports consuming regular diet without issues and denies N/V. Patient reports 0 bowel movement + passing flatus.  Denies issues with urination and reports bleeding is lighter.  Patient is Pump feeding and reports going well.  Desires undecided for postpartum contraception.  Pain is being appropriately managed with use of po meds. Denies HA, RUQ pain or vision changes. Pt did complain of bi lateral legs but stated it has been intermittent since early pregnancy, feels like squeezing, has seen chiropractor without relief, taken baths, nothing make sit better or worse, pt was given muscle relaxant this morning and decline motrin  and tylenol  for pain last night, appears to be muscle in etiology, electrolytes WNL, most likely r/t pregnancy will monitor and pt will f/u with pcp if this continue or go to [facility] if severe pain, leg swelling, or redness appears. Recommended daily stretching, pt feels she has pretty immobile during pregnancy and not walking much, she feels this might be from inactive verus more serious reasons, may need PT.   Physical exam  Vitals:   10/15/23 1956 10/15/23 2351 10/16/23 0544 10/16/23 0822  BP: (!) 153/87 123/72 136/86 131/85  Pulse: 89 92 89 92  Resp: 17 17 17 16   Temp: 97.9 F (36.6 C) 98.1 F (36.7 C) 98.1 F (36.7 C) 97.9 F (36.6 C)  TempSrc: Oral Oral Oral Oral  SpO2: 98% 98% 98% 98%  Weight:      Height:       General: alert, cooperative, and no distress Lochia: appropriate Uterine Fundus: firm Perineum: approximate DVT Evaluation: No evidence of DVT seen on physical exam. Negative Homan's sign. No cords or calf tenderness. Swelling has gone down and no visible swelling noted.   Labs: Lab Results  Component Value Date   WBC 17.1 (H) 10/14/2023   HGB 11.4 (L) 10/14/2023   HCT 33.2 (L) 10/14/2023   MCV 93.0 10/14/2023   PLT 215 10/14/2023      Latest Ref Rng & Units 10/14/2023    6:52 AM  CMP  Glucose 70 - 99 mg/dL 893   BUN 6 - 20 mg/dL 10    Creatinine 9.55 - 1.00 mg/dL 9.26   Sodium 864 - 854 mmol/L 136   Potassium 3.5 - 5.1 mmol/L 4.4   Chloride 98 - 111 mmol/L 103   CO2 22 - 32 mmol/L 19   Calcium 8.9 - 10.3 mg/dL 7.6   Total Protein 6.5 - 8.1 g/dL 5.5   Total Bilirubin 0.0 - 1.2 mg/dL 0.5   Alkaline Phos 38 - 126 U/L 80   AST 15 - 41 U/L 25   ALT 0 - 44 U/L 14     Date of discharge: 10/16/2023 Discharge Diagnoses: Preelampsia and with preterm delivery  Discharge instruction: per After Visit Summary and Baby and Me Booklet.  After visit meds:   Activity:  unrestricted and pelvic rest Advance as tolerated. Pelvic rest for 6 weeks.  Diet:                routine Medications: PNV, Ibuprofen , Colace, Iron, and procardia  60mg  XL  Postpartum contraception: Undecided Condition:  Pt discharge to home in [place] with baby who is currently on bili lights in  stable condition  Meds: Allergies as of 10/16/2023   No Known Allergies      Medication List     STOP taking these medications    multivitamin capsule       TAKE these medications    aspirin EC 81 MG tablet Take 81 mg by mouth daily. Swallow whole.   docusate sodium  50 MG capsule Commonly known as: COLACE Take 50 mg by mouth 2 (two) times daily.   ibuprofen  600 MG tablet Commonly known as: ADVIL  Take 1 tablet (600 mg total) by mouth every 6 (six) hours.   MAGNESIUM  PO Take by mouth.   multivitamin-prenatal 27-0.8 MG Tabs tablet Take 1 tablet by mouth daily at 12 noon.   NIFEdipine  60 MG 24 hr tablet Commonly known as: ADALAT  CC Take 1 tablet (60 mg total) by mouth daily.        Discharge Follow Up:   Follow-up Information     [facility]. Schedule an appointment as soon as possible for a visit in 1 week(s).   Specialty: Obstetrics and Gynecology Why: 1 week BP and mood check, and 6 weeks postpartum visit. Contact information: [address] [place]   [address] [phone]                 Lucylle Foulkes  CNM, FNP-C, PMHNP-BC  [id number]  [place], [place] [address]  Cell: [phone]  Office Phone: [phone] Fax: [phone] 10/16/2023  10:20 AM

## 2023-10-15 NOTE — Lactation Note (Signed)
 This note was copied from a baby's chart. Lactation Consultation Note  Patient Name: [name] Unijb'd Date: 10/15/2023 Age:36 hours Reason for consult: Follow-up assessment;Late-preterm 34-36.6wks;Hyperbilirubinemia;1st time breastfeedingAMA  LC in to visit with P2 Mom of LPTI delivered vaginally and is at a 5% weight loss and under double phototherapy.  Mom is choosing to pump and bottle feed currently.  Baby had just fed with SLP 15 ml EBM by bottle using the Nfant Slow Flow nipple.  LC noted that baby was stirring and cueing.  Encouraged Mom to feed baby.  LC assisted baby to be fed another 10 ml of EBM.  Mom to use the maintain mode on the pump now that her volume exceeds 20 ml per session.  Mom encouraged to pump at every feeding baby has or 8 times per 24 hrs.  LC washed all the pump parts, and placed in drying basin with clean paper towels to dry. Mom encouraged to use the basins when washing and drying pump parts.  Engorgement prevention and treatment reviewed. Encouraged Mom to call prn for concerns or assistance.   Lactation Tools Discussed/Used Tools: Pump;Flanges;Hands-free pumping top;Bottle Flange Size: 18 Breast pump type: Double-Electric Breast Pump Pump Education: Setup, frequency, and cleaning;Milk Storage Reason for Pumping: support milk supply/LPTI/ hyperbilirubinemia Pumping frequency: every 3 hrs Pumped volume: 35 mL  Interventions Interventions: Skin to skin;Breast feeding basics reviewed;DEBP;Education  Discharge Discharge Education: Engorgement and breast care Pump: Manual;Hands Free;Personal (Lansinoh)  Consult Status Consult Status: Follow-up Date: 10/16/23 Follow-up type: In-patient    Claudene Aleck BRAVO 10/15/2023, 10:43 AM

## 2023-10-16 ENCOUNTER — Other Ambulatory Visit (HOSPITAL_COMMUNITY): Payer: Self-pay

## 2023-10-16 LAB — BPAM RBC
Blood Product Expiration Date: 202509252359
Blood Product Expiration Date: 202509252359
ISSUE DATE / TIME: 202508310748
Unit Type and Rh: 6200
Unit Type and Rh: 6200

## 2023-10-16 LAB — TYPE AND SCREEN
ABO/RH(D): A POS
Antibody Screen: NEGATIVE
Unit division: 0
Unit division: 0

## 2023-10-16 MED ORDER — CYCLOBENZAPRINE HCL 10 MG PO TABS
10.0000 mg | ORAL_TABLET | Freq: Once | ORAL | Status: AC
  Administered 2023-10-16: 10 mg via ORAL
  Filled 2023-10-16: qty 1

## 2023-10-16 MED ORDER — NIFEDIPINE ER 60 MG PO TB24
60.0000 mg | ORAL_TABLET | Freq: Every day | ORAL | 0 refills | Status: AC
  Filled 2023-10-16: qty 30, 30d supply, fill #0

## 2023-10-16 MED ORDER — IBUPROFEN 600 MG PO TABS
600.0000 mg | ORAL_TABLET | Freq: Four times a day (QID) | ORAL | 0 refills | Status: AC
Start: 2023-10-16 — End: ?
  Filled 2023-10-16: qty 30, 8d supply, fill #0

## 2023-10-16 NOTE — Social Work (Signed)
 CSW attempted to meet with MOB again but MOB was still asleep. CSW made aware that MOB would be rooming in with her infant. CSW will attempt to see MOB tomorrow.   Nat Quiet, MSW, LCSW Clinical Social Worker  [phone] 10/16/2023  2:32 PM

## 2023-10-16 NOTE — Lactation Note (Signed)
 This note was copied from a baby's chart. Lactation Consultation Note  Patient Name: Boy Purity Irmen Unijb'd Date: 10/16/2023 Age:36 hours Reason for consult: Follow-up assessment;NICU baby;Late-preterm 34-36.6wks;Exclusive pumping and bottle feeding;Hyperbilirubinemia;Infant weight loss;Maternal discharge (LGA, AMA, - 4.86 % WL)  Visited with family of 32 64/59 weeks old AGA NICU female [name]; Ms. Barner is a P2 and reported she's been pumping mostly every 3 hours, her milk came in, praised her for all her efforts. Noticed that pumping hasn't been consistent at night. She got discharged from [facility] today but [name] is staying as a baby patient due to undergoing triple phototherapy. Reviewed the importance of consistent pumping every 3 hours or whenever baby is taking a bottle for the prevention of engorgement and to protect her supply.   Feeding Mother's Current Feeding Choice: Breast Milk  Lactation Tools Discussed/Used Tools: Pump;Flanges;Hands-free pumping top Flange Size: 18 Breast pump type: Double-Electric Breast Pump Pump Education: Setup, frequency, and cleaning;Milk Storage Reason for Pumping: support milk supply, LPI, hyperbilirubinemia Pumping frequency: 5 times/24 hours Pumped volume: 90 mL  Interventions Interventions: Breast feeding basics reviewed;Coconut oil;DEBP;Education  Plan STS once able to Massage and hand express both breasts prior/after pumping, coconut oil prior to pumping Pump both breasts on maintain mode every 3 hours for 15-30 minutes, ideally 8 pumping sessions/24 hours Family will continue working on bottle feedings with EBM/Similac 22 calorie formula every 3 hours, > 28 ml on day 3   MGM and FOB present and supportive. All questions and concerns answered, family to contact [facility] services PRN.  Discharge Discharge Education: Engorgement and breast care Pump: Manual;Hands Free;Personal (Lansinoh wearable and Medela hand pump)  Consult Status Consult  Status: Follow-up Date: 10/17/23 Follow-up type: In-patient   Jimmie Dattilio GORMAN Crate 10/16/2023, 4:39 PM

## 2023-10-16 NOTE — Social Work (Signed)
 CSW acknowledged consult H/O traumatic past with episode of anxiety and depression. CSW attempted to meet with MOB but she fell asleep during the assessment. CSW agreed to follow up with MOB at a later time. RN made aware.    Nat Quiet, MSW, LCSW Clinical Social Worker  [phone] 10/16/2023  10:57 AM

## 2023-10-17 ENCOUNTER — Inpatient Hospital Stay (EMERGENCY_DEPARTMENT_HOSPITAL)

## 2023-10-17 ENCOUNTER — Inpatient Hospital Stay (HOSPITAL_COMMUNITY)
Admission: AD | Admit: 2023-10-17 | Discharge: 2023-10-17 | Disposition: A | Discharge status: Home / Self Care | Attending: Obstetrics and Gynecology | Admitting: Obstetrics and Gynecology

## 2023-10-17 DIAGNOSIS — K5090 Crohn's disease, unspecified, without complications: Secondary | ICD-10-CM | POA: Diagnosis not present

## 2023-10-17 DIAGNOSIS — M79605 Pain in left leg: Secondary | ICD-10-CM

## 2023-10-17 DIAGNOSIS — Z7982 Long term (current) use of aspirin: Secondary | ICD-10-CM | POA: Insufficient documentation

## 2023-10-17 DIAGNOSIS — M25551 Pain in right hip: Secondary | ICD-10-CM | POA: Insufficient documentation

## 2023-10-17 DIAGNOSIS — M79604 Pain in right leg: Secondary | ICD-10-CM | POA: Diagnosis not present

## 2023-10-17 DIAGNOSIS — M25552 Pain in left hip: Secondary | ICD-10-CM | POA: Diagnosis not present

## 2023-10-17 DIAGNOSIS — M791 Myalgia, unspecified site: Secondary | ICD-10-CM | POA: Diagnosis not present

## 2023-10-17 DIAGNOSIS — M79661 Pain in right lower leg: Secondary | ICD-10-CM

## 2023-10-17 DIAGNOSIS — M79662 Pain in left lower leg: Secondary | ICD-10-CM | POA: Insufficient documentation

## 2023-10-17 DIAGNOSIS — Z79899 Other long term (current) drug therapy: Secondary | ICD-10-CM | POA: Diagnosis not present

## 2023-10-17 LAB — CK: Total CK: 96 U/L (ref 38–234)

## 2023-10-17 LAB — CBC WITH DIFFERENTIAL/PLATELET
Abs Immature Granulocytes: 0.08 K/uL — ABNORMAL HIGH (ref 0.00–0.07)
Basophils Absolute: 0.1 K/uL (ref 0.0–0.1)
Basophils Relative: 1 %
Eosinophils Absolute: 0.3 K/uL (ref 0.0–0.5)
Eosinophils Relative: 3 %
HCT: 38.7 % (ref 36.0–46.0)
Hemoglobin: 13.3 g/dL (ref 12.0–15.0)
Immature Granulocytes: 1 %
Lymphocytes Relative: 22 %
Lymphs Abs: 2.3 K/uL (ref 0.7–4.0)
MCH: 31.7 pg (ref 26.0–34.0)
MCHC: 34.4 g/dL (ref 30.0–36.0)
MCV: 92.1 fL (ref 80.0–100.0)
Monocytes Absolute: 0.8 K/uL (ref 0.1–1.0)
Monocytes Relative: 8 %
Neutro Abs: 6.9 K/uL (ref 1.7–7.7)
Neutrophils Relative %: 65 %
Platelets: 186 K/uL (ref 150–400)
RBC: 4.2 MIL/uL (ref 3.87–5.11)
RDW: 13.7 % (ref 11.5–15.5)
WBC: 10.5 K/uL (ref 4.0–10.5)
nRBC: 0 % (ref 0.0–0.2)

## 2023-10-17 LAB — HEPATITIS C ANTIBODY: HCV Ab: NONREACTIVE

## 2023-10-17 LAB — COMPREHENSIVE METABOLIC PANEL WITH GFR
ALT: 24 U/L (ref 0–44)
AST: 28 U/L (ref 15–41)
Albumin: 2.8 g/dL — ABNORMAL LOW (ref 3.5–5.0)
Alkaline Phosphatase: 76 U/L (ref 38–126)
Anion gap: 12 (ref 5–15)
BUN: 14 mg/dL (ref 6–20)
CO2: 21 mmol/L — ABNORMAL LOW (ref 22–32)
Calcium: 9.2 mg/dL (ref 8.9–10.3)
Chloride: 105 mmol/L (ref 98–111)
Creatinine, Ser: 0.74 mg/dL (ref 0.44–1.00)
GFR, Estimated: >60 mL/min (ref >60)
Glucose, Bld: 80 mg/dL (ref 70–99)
Potassium: 4.2 mmol/L (ref 3.5–5.1)
Sodium: 138 mmol/L (ref 135–145)
Total Bilirubin: 0.7 mg/dL (ref 0.0–1.2)
Total Protein: 6.2 g/dL — ABNORMAL LOW (ref 6.5–8.1)

## 2023-10-17 LAB — VITAMIN D 25 HYDROXY (VIT D DEFICIENCY, FRACTURES): Vit D, 25-Hydroxy: 30.87 ng/mL (ref 30–100)

## 2023-10-17 LAB — FERRITIN: Ferritin: 28 ng/mL (ref 11–307)

## 2023-10-17 LAB — MAGNESIUM: Magnesium: 1.7 mg/dL (ref 1.7–2.4)

## 2023-10-17 LAB — SEDIMENTATION RATE: Sed Rate: 10 mm/h (ref 0–22)

## 2023-10-17 LAB — TSH: TSH: 1.793 u[IU]/mL (ref 0.350–4.500)

## 2023-10-17 MED ORDER — GABAPENTIN 300 MG PO CAPS: 300.0000 mg | ORAL_CAPSULE | Freq: Three times a day (TID) | ORAL | 1 refills | Status: AC

## 2023-10-17 MED ORDER — CYCLOBENZAPRINE HCL 5 MG PO TABS
10.0000 mg | ORAL_TABLET | Freq: Once | ORAL | Status: AC
  Administered 2023-10-17: 10 mg via ORAL
  Filled 2023-10-17: qty 2

## 2023-10-17 MED ORDER — GABAPENTIN 300 MG PO CAPS
300.0000 mg | ORAL_CAPSULE | Freq: Once | ORAL | Status: AC
  Administered 2023-10-17: 300 mg via ORAL
  Filled 2023-10-17: qty 1

## 2023-10-17 MED ORDER — ORPHENADRINE CITRATE 30 MG/ML IJ SOLN: 60.0000 mg | Freq: Once | INTRAMUSCULAR | Status: DC

## 2023-10-17 MED ORDER — KETOROLAC TROMETHAMINE 15 MG/ML IJ SOLN
15.0000 mg | Freq: Once | INTRAMUSCULAR | Status: AC
  Administered 2023-10-17: 15 mg via INTRAMUSCULAR
  Filled 2023-10-17: qty 1

## 2023-10-17 NOTE — MAU Note (Addendum)
 KIERAN NACHTIGAL is a 38 y.o. at Unknown here in MAU reporting the past 7 months both legs feel like they are being squeezed. Her legs hurt so bad, esp her left leg, that it makes her nauseous. Makes it hard to get up and down. She has seen chiropractor and PT but nothing is helping. States after she delivered she did not have the pain for 2 days but now it is starting back. Pt had svd on 10/13/23 after IOL for PreE. Took ibuprofen  last night but it did not help. Baby is here in OBSC on bili lights.   LMP: na Onset of complaint: 7mos Pain score: 7 Vitals:   10/17/23 0502 10/17/23 0505  BP:  (!) 149/91  Pulse: 98   Resp: 17   Temp: 98.4 F (36.9 C)   SpO2: 97%      FHT: na  Lab orders placed from triage: none '

## 2023-10-17 NOTE — MAU Note (Cosign Needed Addendum)
 Maternal Assessment Unit Provider Note  Subjective: [name] is a 38 y.o. G2P1102 non-pregnant female at 4 days postpartum who presents to MAU today with complaint of bilateral leg pain.   She reports 7 months of leg pain.  She had 2 days of relief after delivery on 9/1 and then the leg pain returned.  Leg pain is severe squeezing/tight quality.  It is bilateral hip to ankle and location.  During pregnancy she feels like she would cramp and lock up from her hip to ankles.  It is not localized to joints. It is debilitating in severity she reports very limited ability to work and financial difficulties due to the above.  She can only now walk for very short distances.  She is very weak due to the pain.  Pain at times is so severe it causes nausea.  Aggravating factors include weightbearing, movement.  Alleviating factors include lying down with very specific positioning and not moving and rubbing alcohol-sometimes helps.  She does not recall any specific events of trauma or illness precipitating leg pain.  She denies swelling, temperature changes of the skin, skin color changes.  She has been previously evaluated by physical therapy who did not think it was sciatica related and discharged her.  She does not have back pain or history of back injury.  She has been treated with Tylenol , muscle relaxants, chiropractic care, heat therapy with no improvement  She was previously very active and now during activities like shopping after about 5 minutes she has to go wait in the car.  She does not take medication other than prenatal, aspirin, magnesium .  No family history of disorders of the muscle.  Receives care at [facility]. Prenatal records reviewed.  Pertinent items noted in HPI and remainder of comprehensive ROS otherwise negative.   Patient has a history of Crohn's disease in remission.  Not on medication.  Objective: BP 129/83 (BP Location: Left Arm)   Pulse 87    Temp 98.4 F (36.9 C)   Resp 16   Ht 5' 8 (1.727 m)   Wt 111.6 kg   SpO2 97%   BMI 37.40 kg/m  Physical Exam Vitals reviewed.  Constitutional:      General: She is not in acute distress.    Appearance: She is well-developed. She is not diaphoretic.  HENT:     Head: Normocephalic and atraumatic.  Eyes:     General: No scleral icterus. Cardiovascular:     Rate and Rhythm: Normal rate and regular rhythm.  Pulmonary:     Effort: Pulmonary effort is normal. No respiratory distress.  Musculoskeletal:        General: No swelling, tenderness or deformity.     Comments: Trace lower extremity edema bilaterally, negative Homans' sign.  Skin:    General: Skin is warm and dry.  Neurological:     General: No focal deficit present.     Mental Status: She is alert.     Sensory: No sensory deficit.     Motor: No weakness.     Gait: Gait abnormal.     Deep Tendon Reflexes: Reflexes normal.     Comments: Lower extremity strength: Hip flexors 5+/5 bilaterally, knee extension 5+/5 bilaterally knee flexion 5+/5 bilaterally, ankle dorsiflexion/plantarflexion 5+/5 bilaterally, great toe dorsiflexion 5+/5 bilaterally.  Stiff gait      MDM: Moderate risk  MAU Course:  Time: 0600 Records and prior workup reviewed. Patient evaluated.  Cyclobenzaprine  10 mg, IM Toradol  15 mg,  and gabapentin  300 mg ordered for treatment of pain.  Patient does not have to drive.  Labs ordered.  Care transitioned to Olam Lukes NP at 0800   Assessment Medical screening exam complete    ICD-10-CM   1. Myalgia  M79.10       Myalgia  Leg pain, bilateral  Plan Discharge from MAU in stable condition with strict return precautions Follow up at [facility] as scheduled for ongoing prenatal care     Trudy Leeroy NOVAK, MD 10/17/2023 8:38 AM   I received care of this patient at 0800. B/L Venous dopplers ordered to r/o DVT or other acute pathology. If negative will plan for discharge and Primary OB f/up .  -  B/L Venous Doppler's negative per Ultrasound tech. No evidence of DVT  Images reviewed. - Plan for discharge  - RX for Gabapentin  sent for pain control since tolerated well here in MAU  Discharge from MAU in stable condition  See AVS for full description of educational information and instructions provided to the patient at time of discharge  Warning signs for worsening condition that would warrant emergency follow-up discussed Patient may return to MAU as needed   Olam Dalton, MSN, [facility], [facility] [employer]

## 2023-10-17 NOTE — Progress Notes (Signed)
 Venous duplex lower ext  has been completed. Refer to [facility] under chart review to view preliminary results.   10/17/2023  9:58 AM [name], Ricka BIRCH ]

## 2023-10-17 NOTE — Lactation Note (Signed)
 This note was copied from a baby's chart. Lactation Consultation Note  Patient Name: [name] Date: 10/17/2023 Age:36 days Reason for consult: Follow-up assessment;Late-preterm 34-36.6wks;Infant weight loss;Hyperbilirubinemia;Other (Comment);Exclusive pumping and bottle feeding (AMA, LGA baby)  LC in to visit with P2 Mom of LPT baby [name].  Baby is now just on bili blanket and being able to be held.  Mom is pumping consistently and expressing 240 ml per session.    Baby is feeding slowly, SLP consulted this am.  To provide a Dr. Orlinda premie bottle to help baby increase volume hopefully.  Baby is at a 7.4% weight loss today.  No discharge today.   Mom provided with more bottles Mom has WIC, so LC to send a referral to [facility] for a home pump.   Lactation Tools Discussed/Used Tools: Pump;Flanges Flange Size: 18 Breast pump type: Double-Electric Breast Pump Pump Education: Setup, frequency, and cleaning;Milk Storage Reason for Pumping: support milk supply Pumping frequency: 8 times per 24 hrs Pumped volume: 240 mL  Interventions Interventions: Skin to skin;Breast massage;Hand express;DEBP;Education  Discharge Discharge Education: Engorgement and breast care Pump: Hands Free;Personal (Lansinoh wearable) WIC Program: Yes  Consult Status Consult Status: Follow-up Date: 10/18/23 Follow-up type: In-patient    [name] 10/17/2023, 11:33 AM

## 2023-10-17 NOTE — MAU Note (Signed)
 Provider at bedside as pt asking about and specifically wanting pain medication. I noticed that when I hold the baby, my legs hurt.

## 2023-10-18 LAB — ANA W/REFLEX IF POSITIVE: Anti Nuclear Antibody (ANA): NEGATIVE

## 2023-10-18 NOTE — Lactation Note (Signed)
 This note was copied from a baby's chart. Lactation Consultation Note  Patient Name: [name] Date: 10/18/2023 Age:36 days Reason for consult: Follow-up assessment;Exclusive pumping and bottle feeding;Late-preterm 34-36.6wks;Hyperbilirubinemia (double phototherapy)  LC in to visit with family of baby [name] LPTI on double phototherapy for hyperbilirubinemia.  Baby gained 16 gms and is at 8% weight loss.  Mom chose to go home to rest currently, but will be back later per FOB.   Baby is bottle feeding EBM by bottle, increasing volumes and frequency. LC asked FOB to have Mom request LC prn.  Lactation Tools Discussed/Used Tools: Pump;Flanges;Bottle Flange Size: 18 Breast pump type: Double-Electric Breast Pump Pump Education: Setup, frequency, and cleaning;Milk Storage Reason for Pumping: support milk supply/infant bottle feeding Pumping frequency: 8 times per 24 hrs Pumped volume: 240 mL  Interventions Interventions: DEBP  Consult Status Consult Status: Follow-up Date: 10/19/23 Follow-up type: In-patient    Claudene Aleck BRAVO 10/18/2023, 4:42 PM

## 2023-10-19 NOTE — Lactation Note (Signed)
 This note was copied from a baby's chart. Lactation Consultation Note  Patient Name: [name] Date: 10/19/2023 Age:36 days Reason for consult: Follow-up assessment;NICU baby;Exclusive pumping and bottle feeding;Late-preterm 34-36.6wks;Hyperbilirubinemia;Other (Comment) (LGA, AMA, - 4.86% WL)  Visited with family of 88 31/55 weeks old female [name]; [name] is a P2 and reports she's pumping every 3 hours most of the time, her supply remains steady and robust, praised her for all her efforts. [name] continues undergoing phototherapy and feeding ad lib every 1-2 hours; he's been more awake and alert. No S/S of engorgement at this time. Re-educated about the importance of pumping whenever he's taking a bottle for the prevention of engorgement and to develop a full supply.  Feeding Mother's Current Feeding Choice: Breast Milk Nipple Type: Dr. Jonna Fuentes Preemie  Lactation Tools Discussed/Used Tools: Pump;Flanges;Hands-free pumping top;Coconut oil Flange Size: 18 Breast pump type: Double-Electric Breast Pump Pump Education: Setup, frequency, and cleaning;Milk Storage Reason for Pumping: patien't choice, support milk supply Pumping frequency: 5 times/24 hours Pumped volume: 180 mL  Interventions Interventions: Breast feeding basics reviewed;Coconut oil;DEBP  Plan STS once able to Pump both breasts on maintain mode every 3 hours for 15-30 minutes, ideally 8 pumping sessions/24 hours Family will continue working on bottle feedings ad lib with EBM > 30 ml as today and will increase to at least 40 ml or more tomorrow depending on feeding frequency   FOB present and supportive. All questions and concerns answered, family to contact [facility] services PRN.  Discharge Pump: Manual;Hands Free;Personal (Manual;Hands Free;Personal (Lansinoh wearable and Medela hand pump))  Consult Status Consult Status: Follow-up Date: 10/20/23 Follow-up type: In-patient   [name] 10/19/2023, 5:11 PM

## 2023-10-20 LAB — CYCLIC CITRUL PEPTIDE ANTIBODY, IGG/IGA: CCP Antibodies IgG/IgA: 3 U (ref 0–19)

## 2023-10-20 LAB — ANCA TITERS
Atypical P-ANCA titer: <1:20 {titer}
C-ANCA: <1:20 {titer}
P-ANCA: <1:20 {titer}

## 2023-10-21 LAB — RHEUMATOID FACTOR: Rheumatoid fact SerPl-aCnc: 15.5 [IU]/mL — ABNORMAL HIGH (ref <14.0)

## 2023-10-27 ENCOUNTER — Telehealth (HOSPITAL_COMMUNITY): Payer: Self-pay | Admitting: *Deleted

## 2023-10-27 NOTE — Telephone Encounter (Signed)
 10/27/2023  Name: TARSHIA KOT MRN: [id number] DOB: 10-17-1985  Reason for Call:  Transition of Care Hospital Discharge Call  Contact Status: Patient Contact Status: Complete  Language assistant needed: Interpreter Mode: Interpreter Not Needed        Follow-Up Questions: Do You Have Any Concerns About Your Health As You Heal From Delivery?: No Do You Have Any Concerns About Your Infants Health?: No  Edinburgh Postnatal Depression Scale:  In the Past 7 Days: I have been able to laugh and see the funny side of things.: As much as I always could I have looked forward with enjoyment to things.: As much as I ever did I have blamed myself unnecessarily when things went wrong.: No, never I have been anxious or worried for no good reason.: Hardly ever I have felt scared or panicky for no good reason.: No, not at all Things have been getting on top of me.: No, I have been coping as well as ever I have been so unhappy that I have had difficulty sleeping.: Not very often I have felt sad or miserable.: No, not at all I have been so unhappy that I have been crying.: No, never The thought of harming myself has occurred to me.: Never Edinburgh Postnatal Depression Scale Total: 2  PHQ2-9 Depression Scale:     Discharge Follow-up: Edinburgh score requires follow up?: No Patient was advised of the following resources:: Support Group, Breastfeeding Support Group  Post-discharge interventions: Reviewed Newborn Safe Sleep Practices  Mliss Sieve, RN 10/27/2023 10:13

## 2023-10-29 ENCOUNTER — Ambulatory Visit

## 2023-10-29 ENCOUNTER — Other Ambulatory Visit

## 2024-03-17 ENCOUNTER — Ambulatory Visit (HOSPITAL_COMMUNITY)
Admission: EM | Admit: 2024-03-17 | Discharge: 2024-03-17 | Disposition: A | Discharge status: Home / Self Care | Source: Home / Self Care | Attending: Family Medicine | Admitting: Family Medicine

## 2024-03-17 ENCOUNTER — Encounter (HOSPITAL_COMMUNITY): Payer: Self-pay | Admitting: *Deleted

## 2024-03-17 DIAGNOSIS — M5441 Lumbago with sciatica, right side: Secondary | ICD-10-CM

## 2024-03-17 DIAGNOSIS — G8929 Other chronic pain: Secondary | ICD-10-CM

## 2024-03-17 MED ORDER — DEXAMETHASONE SOD PHOSPHATE PF 10 MG/ML IJ SOLN
10.0000 mg | Freq: Once | INTRAMUSCULAR | Status: AC
  Administered 2024-03-17: 10 mg via INTRAMUSCULAR

## 2024-03-17 MED ORDER — DEXAMETHASONE SOD PHOSPHATE PF 10 MG/ML IJ SOLN
INTRAMUSCULAR | Status: AC
  Filled 2024-03-17: qty 1

## 2024-03-17 NOTE — ED Triage Notes (Signed)
 Pt states that she has right lower back pain radiating down her leg. She thinks its sciatica she has been having this pain since 03/2023 but was pregnant so she couldn't get treatment. She is not taking any meds for the pain.

## 2024-03-17 NOTE — Discharge Instructions (Signed)
 Meds ordered this encounter  Medications   dexamethasone (DECADRON) injection 10 mg
